# Patient Record
Sex: Female | Born: 1970 | Race: Black or African American | Hispanic: No | Smoking: Never smoker
Health system: Southern US, Community
[De-identification: ages and names within clinical notes are randomized; demographics above are authoritative.]

## PROBLEM LIST (undated history)

## (undated) DIAGNOSIS — Z98811 Dental restoration status: Secondary | ICD-10-CM

## (undated) DIAGNOSIS — T7840XA Allergy, unspecified, initial encounter: Secondary | ICD-10-CM

## (undated) DIAGNOSIS — S83249A Other tear of medial meniscus, current injury, unspecified knee, initial encounter: Secondary | ICD-10-CM

## (undated) DIAGNOSIS — I10 Essential (primary) hypertension: Secondary | ICD-10-CM

## (undated) HISTORY — DX: Allergy, unspecified, initial encounter: T78.40XA

---

## 1997-07-16 ENCOUNTER — Inpatient Hospital Stay (HOSPITAL_COMMUNITY): Admission: AD | Admit: 1997-07-16 | Discharge: 1997-07-20 | Payer: Self-pay | Admitting: *Deleted

## 1997-09-17 ENCOUNTER — Encounter (HOSPITAL_COMMUNITY): Admission: RE | Admit: 1997-09-17 | Discharge: 1997-12-16 | Payer: Self-pay | Admitting: *Deleted

## 1997-12-21 ENCOUNTER — Encounter (HOSPITAL_COMMUNITY): Admission: RE | Admit: 1997-12-21 | Discharge: 1998-03-21 | Payer: Self-pay | Admitting: *Deleted

## 1999-01-18 ENCOUNTER — Encounter: Payer: Self-pay | Admitting: *Deleted

## 1999-01-18 ENCOUNTER — Ambulatory Visit (HOSPITAL_COMMUNITY): Admission: RE | Admit: 1999-01-18 | Discharge: 1999-01-18 | Payer: Self-pay | Admitting: *Deleted

## 1999-05-02 ENCOUNTER — Inpatient Hospital Stay (HOSPITAL_COMMUNITY): Admission: AD | Admit: 1999-05-02 | Discharge: 1999-05-05 | Payer: Self-pay | Admitting: *Deleted

## 1999-12-13 ENCOUNTER — Emergency Department (HOSPITAL_COMMUNITY): Admission: EM | Admit: 1999-12-13 | Discharge: 1999-12-13 | Payer: Self-pay | Admitting: Emergency Medicine

## 2000-03-26 HISTORY — PX: ENDOMETRIAL ABLATION: SHX621

## 2000-03-26 HISTORY — PX: TUBAL LIGATION: SHX77

## 2000-08-15 ENCOUNTER — Other Ambulatory Visit: Admission: RE | Admit: 2000-08-15 | Discharge: 2000-08-15 | Payer: Self-pay | Admitting: *Deleted

## 2001-09-15 ENCOUNTER — Other Ambulatory Visit: Admission: RE | Admit: 2001-09-15 | Discharge: 2001-09-15 | Payer: Self-pay | Admitting: Obstetrics and Gynecology

## 2002-10-30 ENCOUNTER — Other Ambulatory Visit: Admission: RE | Admit: 2002-10-30 | Discharge: 2002-10-30 | Payer: Self-pay | Admitting: Obstetrics and Gynecology

## 2003-12-25 ENCOUNTER — Emergency Department (HOSPITAL_COMMUNITY): Admission: EM | Admit: 2003-12-25 | Discharge: 2003-12-26 | Payer: Self-pay | Admitting: Emergency Medicine

## 2004-03-26 HISTORY — PX: KNEE ARTHROSCOPY W/ ACL RECONSTRUCTION: SHX1858

## 2004-10-02 ENCOUNTER — Emergency Department (HOSPITAL_COMMUNITY): Admission: EM | Admit: 2004-10-02 | Discharge: 2004-10-02 | Payer: Self-pay | Admitting: Emergency Medicine

## 2004-11-01 ENCOUNTER — Encounter: Admission: RE | Admit: 2004-11-01 | Discharge: 2004-11-01 | Payer: Self-pay | Admitting: Obstetrics and Gynecology

## 2004-11-09 ENCOUNTER — Encounter: Admission: RE | Admit: 2004-11-09 | Discharge: 2004-11-09 | Payer: Self-pay | Admitting: Obstetrics and Gynecology

## 2005-05-21 ENCOUNTER — Encounter: Admission: RE | Admit: 2005-05-21 | Discharge: 2005-05-21 | Payer: Self-pay | Admitting: Obstetrics and Gynecology

## 2005-12-27 ENCOUNTER — Encounter: Admission: RE | Admit: 2005-12-27 | Discharge: 2005-12-27 | Payer: Self-pay | Admitting: Emergency Medicine

## 2006-01-11 ENCOUNTER — Encounter: Admission: RE | Admit: 2006-01-11 | Discharge: 2006-01-11 | Payer: Self-pay | Admitting: Emergency Medicine

## 2007-02-16 ENCOUNTER — Ambulatory Visit (HOSPITAL_COMMUNITY): Admission: RE | Admit: 2007-02-16 | Discharge: 2007-02-16 | Payer: Self-pay | Admitting: Family Medicine

## 2007-03-26 ENCOUNTER — Encounter: Admission: RE | Admit: 2007-03-26 | Discharge: 2007-03-26 | Payer: Self-pay | Admitting: Emergency Medicine

## 2007-03-27 HISTORY — PX: TONSILLECTOMY: SUR1361

## 2008-03-31 ENCOUNTER — Encounter: Admission: RE | Admit: 2008-03-31 | Discharge: 2008-03-31 | Payer: Self-pay | Admitting: Emergency Medicine

## 2009-04-01 ENCOUNTER — Encounter: Admission: RE | Admit: 2009-04-01 | Discharge: 2009-04-01 | Payer: Self-pay | Admitting: Internal Medicine

## 2009-04-26 ENCOUNTER — Encounter: Admission: RE | Admit: 2009-04-26 | Discharge: 2009-04-26 | Payer: Self-pay | Admitting: Internal Medicine

## 2010-01-05 ENCOUNTER — Emergency Department (HOSPITAL_COMMUNITY): Admission: EM | Admit: 2010-01-05 | Discharge: 2010-01-05 | Payer: Self-pay | Admitting: Emergency Medicine

## 2010-04-19 ENCOUNTER — Other Ambulatory Visit: Payer: Self-pay | Admitting: Obstetrics and Gynecology

## 2010-04-19 DIAGNOSIS — Z1231 Encounter for screening mammogram for malignant neoplasm of breast: Secondary | ICD-10-CM

## 2010-04-19 DIAGNOSIS — Z1239 Encounter for other screening for malignant neoplasm of breast: Secondary | ICD-10-CM

## 2010-04-27 ENCOUNTER — Ambulatory Visit: Payer: Self-pay

## 2010-05-01 ENCOUNTER — Ambulatory Visit
Admission: RE | Admit: 2010-05-01 | Discharge: 2010-05-01 | Disposition: A | Discharge status: Home / Self Care | Payer: 59 | Source: Ambulatory Visit | Attending: Obstetrics and Gynecology | Admitting: Obstetrics and Gynecology

## 2010-05-01 DIAGNOSIS — Z1231 Encounter for screening mammogram for malignant neoplasm of breast: Secondary | ICD-10-CM

## 2010-06-07 LAB — POCT PREGNANCY, URINE: Preg Test, Ur: NEGATIVE

## 2010-06-07 LAB — CBC
HCT: 40.6 % (ref 36.0–46.0)
Hemoglobin: 13.7 g/dL (ref 12.0–15.0)
MCH: 29.8 pg (ref 26.0–34.0)
MCHC: 33.7 g/dL (ref 30.0–36.0)
RBC: 4.6 MIL/uL (ref 3.87–5.11)

## 2010-06-07 LAB — COMPREHENSIVE METABOLIC PANEL
AST: 18 U/L (ref 0–37)
Albumin: 3.3 g/dL — ABNORMAL LOW (ref 3.5–5.2)
Alkaline Phosphatase: 66 U/L (ref 39–117)
BUN: 9 mg/dL (ref 6–23)
CO2: 30 mEq/L (ref 19–32)
Chloride: 106 mEq/L (ref 96–112)
Creatinine, Ser: 0.72 mg/dL (ref 0.4–1.2)
GFR calc non Af Amer: >60 mL/min (ref >60)
Potassium: 3.8 mEq/L (ref 3.5–5.1)
Total Bilirubin: 0.7 mg/dL (ref 0.3–1.2)

## 2010-06-07 LAB — CK: Total CK: 79 U/L (ref 7–177)

## 2010-06-07 LAB — URINALYSIS, ROUTINE W REFLEX MICROSCOPIC
Bilirubin Urine: NEGATIVE
Glucose, UA: NEGATIVE mg/dL
Hgb urine dipstick: NEGATIVE
Ketones, ur: NEGATIVE mg/dL
Specific Gravity, Urine: 1.01 (ref 1.005–1.030)
pH: 7.5 (ref 5.0–8.0)

## 2010-06-07 LAB — DIFFERENTIAL
Basophils Absolute: 0 10*3/uL (ref 0.0–0.1)
Basophils Relative: 0 % (ref 0–1)
Eosinophils Relative: 2 % (ref 0–5)
Lymphocytes Relative: 27 % (ref 12–46)
Monocytes Absolute: 0.5 10*3/uL (ref 0.1–1.0)
Neutro Abs: 4.5 10*3/uL (ref 1.7–7.7)

## 2010-10-12 ENCOUNTER — Emergency Department (HOSPITAL_COMMUNITY)
Admission: EM | Admit: 2010-10-12 | Discharge: 2010-10-12 | Disposition: A | Discharge status: Home / Self Care | Payer: 59 | Attending: Emergency Medicine | Admitting: Emergency Medicine

## 2010-10-12 DIAGNOSIS — I10 Essential (primary) hypertension: Secondary | ICD-10-CM | POA: Insufficient documentation

## 2010-10-12 DIAGNOSIS — R51 Headache: Secondary | ICD-10-CM | POA: Insufficient documentation

## 2011-01-16 ENCOUNTER — Other Ambulatory Visit: Payer: Self-pay | Admitting: Obstetrics and Gynecology

## 2011-02-24 HISTORY — PX: ABDOMINAL HYSTERECTOMY: SHX81

## 2011-03-02 ENCOUNTER — Encounter (HOSPITAL_COMMUNITY): Payer: Self-pay | Admitting: Pharmacist

## 2011-03-05 NOTE — Patient Instructions (Addendum)
    Your procedure is scheduled on: Monday, Dec 17th  Enter through the [employer] [facility] at: 8:30am Pick up the phone at the desk and dial [phone] and inform us of your arrival.  Please call this number if you have any problems the morning of surgery: [phone]  Remember: Do not eat food after midnight: Sunday Do not drink clear liquids after: Sunday Take these medicines the morning of surgery with a SIP OF WATER: None  Do not wear jewelry, make-up, or FINGER nail polish Do not wear lotions, powders, perfumes or deodorant. Do not shave 48 hours prior to surgery. Do not bring valuables to the hospital.  Leave suitcase in the car. After Surgery it may be brought to your room. For patients being admitted to the hospital, checkout time is 11:00am the day of discharge. Home with Daughter Warrick Parisian  cell [phone]  Remember to use your hibiclens as instructed.Please shower with 1/2 bottle the evening before your surgery and the other 1/2 bottle the morning of surgery.

## 2011-03-06 ENCOUNTER — Encounter (HOSPITAL_COMMUNITY): Payer: Self-pay

## 2011-03-06 ENCOUNTER — Ambulatory Visit (INDEPENDENT_AMBULATORY_CARE_PROVIDER_SITE_OTHER): Payer: 59

## 2011-03-06 ENCOUNTER — Encounter (HOSPITAL_COMMUNITY)
Admission: RE | Admit: 2011-03-06 | Discharge: 2011-03-06 | Disposition: A | Discharge status: Home / Self Care | Payer: 59 | Source: Ambulatory Visit | Attending: Obstetrics and Gynecology | Admitting: Obstetrics and Gynecology

## 2011-03-06 DIAGNOSIS — J40 Bronchitis, not specified as acute or chronic: Secondary | ICD-10-CM

## 2011-03-06 DIAGNOSIS — R5383 Other fatigue: Secondary | ICD-10-CM

## 2011-03-06 DIAGNOSIS — I10 Essential (primary) hypertension: Secondary | ICD-10-CM

## 2011-03-06 DIAGNOSIS — R5381 Other malaise: Secondary | ICD-10-CM

## 2011-03-06 DIAGNOSIS — Z79899 Other long term (current) drug therapy: Secondary | ICD-10-CM

## 2011-03-06 HISTORY — DX: Essential (primary) hypertension: I10

## 2011-03-06 LAB — CBC
Hemoglobin: 14.2 g/dL (ref 12.0–15.0)
MCH: 29.7 pg (ref 26.0–34.0)
MCHC: 33.9 g/dL (ref 30.0–36.0)
Platelets: 221 10*3/uL (ref 150–400)

## 2011-03-06 LAB — BASIC METABOLIC PANEL
BUN: 9 mg/dL (ref 6–23)
Calcium: 9.2 mg/dL (ref 8.4–10.5)
GFR calc Af Amer: >90 mL/min (ref >90)
GFR calc non Af Amer: >90 mL/min (ref >90)
Potassium: 3.8 mEq/L (ref 3.5–5.1)
Sodium: 135 mEq/L (ref 135–145)

## 2011-03-06 LAB — SURGICAL PCR SCREEN
MRSA, PCR: NEGATIVE
Staphylococcus aureus: NEGATIVE

## 2011-03-06 NOTE — Pre-Procedure Instructions (Signed)
Dr Malen Gauze saw this patient at pre-op appt.

## 2011-03-06 NOTE — Anesthesia Preprocedure Evaluation (Addendum)
Anesthesia Evaluation  Patient identified by MRN, date of birth, ID band Patient awake    Reviewed: Allergy & Precautions, H&P , NPO status , Patient's Chart, lab work & pertinent test results, reviewed documented beta blocker date and time   Airway Mallampati: I TM Distance: >3 FB Neck ROM: Full    Dental  (+) Teeth Intact   Pulmonary Recent URI  (has finished z-pack, no fever since prior to Dec 6th, never productive cough - 03/12/11), Resolved,  12/11 - Patient has current URI, placed on Z-Pak yesterday along with Tessalon Pearls. No fever, no chills, clear runny nose, and dry cough.  clear to auscultation        Cardiovascular Exercise Tolerance: Good hypertension (135/84 today), On Medications Regular Normal Takes HCTZ for BP.   Neuro/Psych Negative Neurological ROS  Negative Psych ROS   GI/Hepatic negative GI ROS, Neg liver ROS,   Endo/Other  Morbid obesity  Renal/GU negative Renal ROS  Genitourinary negative   Musculoskeletal negative musculoskeletal ROS (+)   Abdominal (+) obese,   Peds  Hematology negative hematology ROS (+)   Anesthesia Other Findings   Reproductive/Obstetrics negative OB ROS                         Anesthesia Physical Anesthesia Plan  ASA: III  Anesthesia Plan: General   Post-op Pain Management:    Induction: Intravenous  Airway Management Planned: Oral ETT  Additional Equipment:   Intra-op Plan:   Post-operative Plan:   Informed Consent: I have reviewed the patients History and Physical, chart, labs and discussed the procedure including the risks, benefits and alternatives for the proposed anesthesia with the patient or authorized representative who has indicated his/her understanding and acceptance.   Dental Advisory Given  Plan Discussed with: Anesthesiologist, Surgeon and CRNA  Anesthesia Plan Comments: (Discussed with patient risks of GETA with  current URI Patient to see regular MD today. Will reevaluate DOS if not cancelled by PMD.)       Anesthesia Quick Evaluation

## 2011-03-08 NOTE — H&P (Signed)
NAMEJUDYE, LORINO                 ACCOUNT NO.:  [id number]  MEDICAL RECORD NO.:  [id number]  LOCATION:                                 FACILITY:  PHYSICIAN:  Naima A. Dillard, M.D. DATE OF BIRTH:  [birth year]  DATE OF ADMISSION: DATE OF DISCHARGE:                             HISTORY & PHYSICAL   HISTORY OF PRESENT ILLNESS:  Ms. 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  The patient has a longstanding history of menometrorrhagia and underwent a Thermachoice endometrial ablation in 2003.  Prior to that procedure, the patient would flow 21 of 28 days that was so heavy that it would often soil her clothing and her linen. She denies any cramping however with that bleeding.  For 2 years following the ablation, the patient was amenorrheic, however, after that time, she began to experience severe menstrual cramping which she rates at a 10/10 on a 10-point pain scale and began to flow 3-7 days out of each month accompanied by intermenstrual spotting.  By 2008, the patient continued this course, however, her bleeding increased with episodes of "gushes" of blood occurring without notice.  For a short time, the patient was managed with oral contraceptives, however, that therapy quickly became ineffective.  Currently, the patient experiences 4 days of spotting prior to a 5 day heavy menstrual flow that again has begun to soil her clothes due to its volume.  Following that bleeding, she will again experience 4 additional days of spotting.  In October 2012, the patient had an endometrial biopsy that did not show any atypia, hyperplasia, or malignancy.  A pelvic ultrasound at that same time showed a uterus measuring 8.51 x 4.46 x 4.89 cm.  It was heterogeneous, anteverted, with 2 small intramural fibroid measuring 1.1 x 0.94 x 1.3 cm in the anterior of  the uterus, and a fundal fibroid measuring 2.1 x 1.4 x 2.2 cm.  Both of the patient's ovaries appeared normal on that study.  The patient denies any urinary tract symptoms, changes in her bowel movements, vaginitis symptoms, except as is related to her menstrual flow, she denies any pelvic pain.  A review of both medical and surgical management options were given to the patient, however, she has decided to proceed with definitive therapy in the form of hysterectomy.  PAST MEDICAL HISTORY:  OB History:  Gravida 4, para 3-0-1-3.  The patient had a spontaneous vaginal birth in 1994, 1999, and 2001.  She experienced pregnancy-induced hypertension and her largest baby weighed 8 pounds 5 ounces.  GYN History:  Menarche at 40 year old.  Last menstrual period February 15, 2011.  She uses tubal ligation as her method of contraception.  She has a history of herpes simplex virus # 2 and a remote history of Chlamydia.  She has a history of an abnormal Pap smear, however, that Pap smear once repeated, returned normal, and have been normal since. Her most recent normal Pap smear was August 2012.  Medical History:  Hypertension, anemia, right ankle fracture (1979) and vitamin D deficiency.  SURGERY:  In 2003, Thermachoice  endometrial ablation.  In 2003, bilateral tubal ligation.  In 2006, left knee anterior cruciate ligament repair.  In 2010, tonsillectomy.  Denies any history of blood transfusions or problems with anesthesia.  FAMILY HISTORY:  Cardiovascular disease.  Mother with breast cancer, hypertension, and anemia.  SOCIAL HISTORY:  The patient is divorced and she works for [employer].  HABITS:  She does not use tobacco, alcohol, or illicit drugs.  CURRENT MEDICATIONS: 1. Multivitamin 1 tab daily. 2. Hydrochlorothiazide 12.5 mg daily. 3. Robitussin CF as directed.  ALLERGIES:  The patient has a sensitivity to NOROXIN, which causes cramping, LISINOPRIL causes profound frustration with  leg cramps and swelling.  REVIEW OF SYSTEMS:  She denies any chest pain, shortness of breath, headache, vision changes, nausea, vomiting, diarrhea, myalgias, arthralgias, skin rashes.  She does admit to a dry cough, discomfort with coughing, however, is scheduled to see her family physician today for evaluation.  PHYSICAL EXAM:  VITAL SIGNS:  Blood pressure 130/82, pulse is 78, temperature 98.5 degrees Fahrenheit orally, weight 193 pounds.  Height 5 feet 2 inches tall.  Body mass index 35.3. NECK:  Supple without masses.  There is no thyromegaly or cervical adenopathy. HEART:  Regular rate and rhythm. LUNGS:  Clear. BACK:  No CVA tenderness. ABDOMEN:  No tenderness, guarding, rebound, or organomegaly. EXTREMITIES:  No clubbing, cyanosis, or edema. PELVIC:  EG, BUS is normal.  Vagina is normal.  Cervix is nontender without lesions.  Uterus appears normal size, shape, and consistency without tenderness.  Adnexa without tenderness or masses.  IMPRESSION: 1. Menometrorrhagia. 2. CIN-1. 3. Status post endometrial ablation.  DISPOSITION:  A discussion was held with the patient regarding indications for her procedure along with its risks, which include, but are not limited to, reaction to anesthesia, damage to adjacent organs, infection, excessive bleeding, pelvic prolapse, and the possibility of early menopause.  The patient was given a MiraLax bowel prep to be completed 24 hours prior to her procedure.  The patient verbalized understanding of these risk and has consented to proceed with a total laparoscopic hysterectomy, followed by cystoscopy at [facility] on March 12, 2011.     Esteen Delpriore J. Lowell Guitar, P.A.-C   ______________________________ Pierre Bali Normand Sloop, M.D.   EJP/MEDQ  D:  03/07/2011  T:  03/08/2011  Job:  [id number]

## 2011-03-10 ENCOUNTER — Ambulatory Visit (INDEPENDENT_AMBULATORY_CARE_PROVIDER_SITE_OTHER): Payer: 59

## 2011-03-10 DIAGNOSIS — I10 Essential (primary) hypertension: Secondary | ICD-10-CM

## 2011-03-10 DIAGNOSIS — J209 Acute bronchitis, unspecified: Secondary | ICD-10-CM

## 2011-03-12 ENCOUNTER — Encounter (HOSPITAL_COMMUNITY): Payer: Self-pay | Admitting: *Deleted

## 2011-03-12 ENCOUNTER — Encounter (HOSPITAL_COMMUNITY)
Admission: RE | Disposition: A | Discharge status: Home / Self Care | Payer: Self-pay | Source: Ambulatory Visit | Attending: Obstetrics and Gynecology

## 2011-03-12 ENCOUNTER — Other Ambulatory Visit: Payer: Self-pay | Admitting: Obstetrics and Gynecology

## 2011-03-12 ENCOUNTER — Encounter (HOSPITAL_COMMUNITY): Payer: Self-pay | Admitting: Anesthesiology

## 2011-03-12 ENCOUNTER — Ambulatory Visit (HOSPITAL_COMMUNITY)
Admission: RE | Admit: 2011-03-12 | Discharge: 2011-03-13 | Disposition: A | Discharge status: Home / Self Care | Payer: 59 | Source: Ambulatory Visit | Attending: Obstetrics and Gynecology | Admitting: Obstetrics and Gynecology

## 2011-03-12 ENCOUNTER — Ambulatory Visit (HOSPITAL_COMMUNITY): Payer: 59 | Admitting: Certified Registered Nurse Anesthetist

## 2011-03-12 DIAGNOSIS — N920 Excessive and frequent menstruation with regular cycle: Secondary | ICD-10-CM | POA: Insufficient documentation

## 2011-03-12 DIAGNOSIS — N870 Mild cervical dysplasia: Secondary | ICD-10-CM | POA: Insufficient documentation

## 2011-03-12 DIAGNOSIS — N8000 Endometriosis of the uterus, unspecified: Secondary | ICD-10-CM | POA: Insufficient documentation

## 2011-03-12 DIAGNOSIS — Z01818 Encounter for other preprocedural examination: Secondary | ICD-10-CM | POA: Insufficient documentation

## 2011-03-12 DIAGNOSIS — Z01812 Encounter for preprocedural laboratory examination: Secondary | ICD-10-CM | POA: Insufficient documentation

## 2011-03-12 DIAGNOSIS — D251 Intramural leiomyoma of uterus: Secondary | ICD-10-CM | POA: Insufficient documentation

## 2011-03-12 HISTORY — PX: LAPAROSCOPIC HYSTERECTOMY: SHX1926

## 2011-03-12 HISTORY — PX: CYSTOSCOPY: SHX5120

## 2011-03-12 SURGERY — HYSTERECTOMY, TOTAL, LAPAROSCOPIC
Anesthesia: General

## 2011-03-12 MED ORDER — MIDAZOLAM HCL 2 MG/2ML IJ SOLN
INTRAMUSCULAR | Status: AC
  Filled 2011-03-12: qty 2

## 2011-03-12 MED ORDER — METOCLOPRAMIDE HCL 5 MG/ML IJ SOLN: 10.0000 mg | Freq: Once | INTRAMUSCULAR | Status: DC | PRN

## 2011-03-12 MED ORDER — AMLODIPINE BESYLATE 10 MG PO TABS
10.0000 mg | ORAL_TABLET | ORAL | Status: DC
  Administered 2011-03-12: 10 mg via ORAL
  Filled 2011-03-12 (×2): qty 1

## 2011-03-12 MED ORDER — PROMETHAZINE HCL 25 MG/ML IJ SOLN: 12.5000 mg | INTRAMUSCULAR | Status: DC | PRN

## 2011-03-12 MED ORDER — GLYCOPYRROLATE 0.2 MG/ML IJ SOLN
INTRAMUSCULAR | Status: DC | PRN
  Administered 2011-03-12: .6 mg via INTRAVENOUS

## 2011-03-12 MED ORDER — LIDOCAINE HCL (CARDIAC) 20 MG/ML IV SOLN
INTRAVENOUS | Status: DC | PRN
  Administered 2011-03-12: 80 mg via INTRAVENOUS

## 2011-03-12 MED ORDER — SODIUM CHLORIDE 0.9 % IJ SOLN: 9.0000 mL | INTRAMUSCULAR | Status: DC | PRN

## 2011-03-12 MED ORDER — ZOLPIDEM TARTRATE 5 MG PO TABS: 5.0000 mg | ORAL_TABLET | Freq: Every evening | ORAL | Status: DC | PRN

## 2011-03-12 MED ORDER — DEXAMETHASONE SODIUM PHOSPHATE 4 MG/ML IJ SOLN
INTRAMUSCULAR | Status: DC | PRN
  Administered 2011-03-12 (×2): 5 mg via INTRAVENOUS

## 2011-03-12 MED ORDER — DIPHENHYDRAMINE HCL 50 MG/ML IJ SOLN: 12.5000 mg | Freq: Four times a day (QID) | INTRAMUSCULAR | Status: DC | PRN

## 2011-03-12 MED ORDER — PROPOFOL 10 MG/ML IV EMUL
INTRAVENOUS | Status: AC
  Filled 2011-03-12: qty 20

## 2011-03-12 MED ORDER — GLYCOPYRROLATE 0.2 MG/ML IJ SOLN
INTRAMUSCULAR | Status: AC
  Filled 2011-03-12: qty 2

## 2011-03-12 MED ORDER — HYDROCODONE-ACETAMINOPHEN 5-325 MG PO TABS: 1.0000 | ORAL_TABLET | ORAL | Status: DC | PRN

## 2011-03-12 MED ORDER — KETOROLAC TROMETHAMINE 30 MG/ML IJ SOLN
INTRAMUSCULAR | Status: DC | PRN
  Administered 2011-03-12: 30 mg via INTRAVENOUS

## 2011-03-12 MED ORDER — LACTATED RINGERS IR SOLN
Status: DC | PRN
  Administered 2011-03-12: 3000 mL

## 2011-03-12 MED ORDER — AMLODIPINE BESYLATE 10 MG PO TABS
10.0000 mg | ORAL_TABLET | Freq: Every day | ORAL | Status: DC
  Filled 2011-03-12 (×2): qty 1

## 2011-03-12 MED ORDER — HYDROMORPHONE HCL PF 1 MG/ML IJ SOLN
INTRAMUSCULAR | Status: DC | PRN
  Administered 2011-03-12: 1 mg via INTRAVENOUS

## 2011-03-12 MED ORDER — DIPHENHYDRAMINE HCL 12.5 MG/5ML PO ELIX
12.5000 mg | ORAL_SOLUTION | Freq: Four times a day (QID) | ORAL | Status: DC | PRN
  Filled 2011-03-12: qty 5

## 2011-03-12 MED ORDER — NEOSTIGMINE METHYLSULFATE 1 MG/ML IJ SOLN
INTRAMUSCULAR | Status: DC | PRN
  Administered 2011-03-12: 4 mg via INTRAVENOUS

## 2011-03-12 MED ORDER — ONDANSETRON HCL 4 MG/2ML IJ SOLN
INTRAMUSCULAR | Status: AC
  Filled 2011-03-12: qty 2

## 2011-03-12 MED ORDER — FENTANYL CITRATE 0.05 MG/ML IJ SOLN
INTRAMUSCULAR | Status: AC
  Filled 2011-03-12: qty 5

## 2011-03-12 MED ORDER — MIDAZOLAM HCL 5 MG/5ML IJ SOLN
INTRAMUSCULAR | Status: DC | PRN
  Administered 2011-03-12: .5 mg via INTRAVENOUS
  Administered 2011-03-12: 1.5 mg via INTRAVENOUS

## 2011-03-12 MED ORDER — LIDOCAINE HCL (CARDIAC) 20 MG/ML IV SOLN
INTRAVENOUS | Status: AC
  Filled 2011-03-12: qty 5

## 2011-03-12 MED ORDER — ROCURONIUM BROMIDE 100 MG/10ML IV SOLN
INTRAVENOUS | Status: DC | PRN
  Administered 2011-03-12 (×2): 10 mg via INTRAVENOUS
  Administered 2011-03-12: 50 mg via INTRAVENOUS

## 2011-03-12 MED ORDER — KETOROLAC TROMETHAMINE 30 MG/ML IJ SOLN
30.0000 mg | Freq: Four times a day (QID) | INTRAMUSCULAR | Status: DC
  Filled 2011-03-12 (×2): qty 1

## 2011-03-12 MED ORDER — NEOSTIGMINE METHYLSULFATE 1 MG/ML IJ SOLN
INTRAMUSCULAR | Status: AC
  Filled 2011-03-12: qty 10

## 2011-03-12 MED ORDER — MENTHOL 3 MG MT LOZG
1.0000 | LOZENGE | OROMUCOSAL | Status: DC | PRN
  Filled 2011-03-12: qty 9

## 2011-03-12 MED ORDER — NALOXONE HCL 0.4 MG/ML IJ SOLN: 0.4000 mg | INTRAMUSCULAR | Status: DC | PRN

## 2011-03-12 MED ORDER — CEFAZOLIN SODIUM 1-5 GM-% IV SOLN
1.0000 g | INTRAVENOUS | Status: AC
  Administered 2011-03-12: 1 g via INTRAVENOUS

## 2011-03-12 MED ORDER — DEXAMETHASONE SODIUM PHOSPHATE 10 MG/ML IJ SOLN
INTRAMUSCULAR | Status: AC
  Filled 2011-03-12: qty 1

## 2011-03-12 MED ORDER — HYDROMORPHONE 0.3 MG/ML IV SOLN
INTRAVENOUS | Status: DC
  Administered 2011-03-12: 17:00:00 via INTRAVENOUS
  Administered 2011-03-12: 0.4 mg via INTRAVENOUS
  Administered 2011-03-13: 0.6 mg via INTRAVENOUS
  Administered 2011-03-13: 0.4 mg via INTRAVENOUS

## 2011-03-12 MED ORDER — PROPOFOL 10 MG/ML IV EMUL
INTRAVENOUS | Status: DC | PRN
  Administered 2011-03-12: 200 mg via INTRAVENOUS

## 2011-03-12 MED ORDER — KETOROLAC TROMETHAMINE 30 MG/ML IJ SOLN
INTRAMUSCULAR | Status: AC
  Filled 2011-03-12: qty 1

## 2011-03-12 MED ORDER — HYDROMORPHONE 0.3 MG/ML IV SOLN
INTRAVENOUS | Status: AC
  Administered 2011-03-12: 0.6 mg via INTRAVENOUS
  Filled 2011-03-12: qty 25

## 2011-03-12 MED ORDER — LACTATED RINGERS IV SOLN
INTRAVENOUS | Status: DC
  Administered 2011-03-12: 09:00:00 via INTRAVENOUS

## 2011-03-12 MED ORDER — KETOROLAC TROMETHAMINE 30 MG/ML IJ SOLN
30.0000 mg | Freq: Four times a day (QID) | INTRAMUSCULAR | Status: DC
  Administered 2011-03-12 – 2011-03-13 (×2): 30 mg via INTRAVENOUS

## 2011-03-12 MED ORDER — FENTANYL CITRATE 0.05 MG/ML IJ SOLN
INTRAMUSCULAR | Status: DC | PRN
  Administered 2011-03-12: 100 ug via INTRAVENOUS
  Administered 2011-03-12: 50 ug via INTRAVENOUS
  Administered 2011-03-12 (×3): 100 ug via INTRAVENOUS

## 2011-03-12 MED ORDER — BUPIVACAINE HCL (PF) 0.25 % IJ SOLN
INTRAMUSCULAR | Status: DC | PRN
  Administered 2011-03-12: 17 mL

## 2011-03-12 MED ORDER — IBUPROFEN 600 MG PO TABS: 600.0000 mg | ORAL_TABLET | Freq: Four times a day (QID) | ORAL | Status: DC | PRN

## 2011-03-12 MED ORDER — DEXTROSE IN LACTATED RINGERS 5 % IV SOLN
INTRAVENOUS | Status: DC
  Administered 2011-03-12 – 2011-03-13 (×2): via INTRAVENOUS

## 2011-03-12 MED ORDER — ROCURONIUM BROMIDE 50 MG/5ML IV SOLN
INTRAVENOUS | Status: AC
  Filled 2011-03-12: qty 1

## 2011-03-12 MED ORDER — ONDANSETRON HCL 4 MG/2ML IJ SOLN
INTRAMUSCULAR | Status: DC | PRN
  Administered 2011-03-12 (×2): 2 mg via INTRAVENOUS

## 2011-03-12 MED ORDER — HYDROMORPHONE HCL PF 1 MG/ML IJ SOLN: 0.2500 mg | INTRAMUSCULAR | Status: DC | PRN

## 2011-03-12 MED ORDER — INDIGOTINDISULFONATE SODIUM 8 MG/ML IJ SOLN
INTRAMUSCULAR | Status: DC | PRN
  Administered 2011-03-12: 40 mg via INTRAVENOUS

## 2011-03-12 MED ORDER — INDIGOTINDISULFONATE SODIUM 8 MG/ML IJ SOLN
INTRAMUSCULAR | Status: AC
  Filled 2011-03-12: qty 5

## 2011-03-12 MED ORDER — HYDROMORPHONE HCL PF 1 MG/ML IJ SOLN
INTRAMUSCULAR | Status: AC
  Filled 2011-03-12: qty 1

## 2011-03-12 MED ORDER — ONDANSETRON HCL 4 MG/2ML IJ SOLN: 4.0000 mg | Freq: Four times a day (QID) | INTRAMUSCULAR | Status: DC | PRN

## 2011-03-12 SURGICAL SUPPLY — 42 items
BARRIER ADHS 3X4 INTERCEED (GAUZE/BANDAGES/DRESSINGS) IMPLANT
CLOTH BEACON ORANGE TIMEOUT ST (SAFETY) ×2 IMPLANT
COVER MAYO STAND STRL (DRAPES) ×2 IMPLANT
DERMABOND ADVANCED (GAUZE/BANDAGES/DRESSINGS) ×1
DERMABOND ADVANCED .7 DNX12 (GAUZE/BANDAGES/DRESSINGS) ×1 IMPLANT
DISSECTOR BLUNT TIP ENDO 5MM (MISCELLANEOUS) IMPLANT
DRAPE CAMERA CLOSED 9X96 (DRAPES) ×2 IMPLANT
EVACUATOR SMOKE 8.L (FILTER) ×4 IMPLANT
GLOVE BIO SURGEON STRL SZ 6.5 (GLOVE) ×4 IMPLANT
GLOVE BIOGEL PI IND STRL 7.0 (GLOVE) ×1 IMPLANT
GLOVE BIOGEL PI IND STRL 7.5 (GLOVE) ×1 IMPLANT
GLOVE BIOGEL PI INDICATOR 7.0 (GLOVE) ×1
GLOVE BIOGEL PI INDICATOR 7.5 (GLOVE) ×1
GOWN PREVENTION PLUS LG XLONG (DISPOSABLE) ×4 IMPLANT
HEMOSTAT SURGICEL 2X14 (HEMOSTASIS) IMPLANT
NS IRRIG 1000ML POUR BTL (IV SOLUTION) ×2 IMPLANT
OCCLUDER COLPOPNEUMO (BALLOONS) ×2 IMPLANT
PACK LAPAROSCOPY BASIN (CUSTOM PROCEDURE TRAY) ×2 IMPLANT
SCALPEL HARMONIC ACE (MISCELLANEOUS) ×2 IMPLANT
SCISSORS LAP 5X35 DISP (ENDOMECHANICALS) IMPLANT
SET CYSTO W/LG BORE CLAMP LF (SET/KITS/TRAYS/PACK) IMPLANT
SET IRRIG TUBING LAPAROSCOPIC (IRRIGATION / IRRIGATOR) ×2 IMPLANT
SLEEVE Z-THREAD 5X100MM (TROCAR) ×2 IMPLANT
SOLUTION ELECTROLUBE (MISCELLANEOUS) ×2 IMPLANT
STRIP CLOSURE SKIN 1/4X4 (GAUZE/BANDAGES/DRESSINGS) IMPLANT
SUT MNCRL AB 3-0 PS2 27 (SUTURE) ×2 IMPLANT
SUT PDS AB 1 CT1 36 (SUTURE) ×10 IMPLANT
SUT VICRYL 0 TIES 12 18 (SUTURE) IMPLANT
SUT VICRYL 0 UR6 27IN ABS (SUTURE) ×4 IMPLANT
SYR 50ML LL SCALE MARK (SYRINGE) ×2 IMPLANT
TIP UTERINE 5.1X6CM LAV DISP (MISCELLANEOUS) ×2 IMPLANT
TIP UTERINE 6.7X10CM GRN DISP (MISCELLANEOUS) IMPLANT
TIP UTERINE 6.7X6CM WHT DISP (MISCELLANEOUS) IMPLANT
TIP UTERINE 6.7X8CM BLUE DISP (MISCELLANEOUS) IMPLANT
TOWEL OR 17X24 6PK STRL BLUE (TOWEL DISPOSABLE) ×4 IMPLANT
TRAY FOLEY CATH 14FR (SET/KITS/TRAYS/PACK) ×2 IMPLANT
TROCAR BALLN 12MMX100 BLUNT (TROCAR) ×2 IMPLANT
TROCAR Z-THREAD FIOS 11X100 BL (TROCAR) ×4 IMPLANT
TROCAR Z-THREAD FIOS 5X100MM (TROCAR) ×2 IMPLANT
TUBING FILTER THERMOFLATOR (ELECTROSURGICAL) ×2 IMPLANT
WARMER LAPAROSCOPE (MISCELLANEOUS) ×2 IMPLANT
WATER STERILE IRR 1000ML POUR (IV SOLUTION) ×2 IMPLANT

## 2011-03-12 NOTE — Preoperative (Signed)
Beta Blockers   Reason not to administer Beta Blockers:Not Applicable 

## 2011-03-12 NOTE — Progress Notes (Signed)
Day of Surgery Procedure(s): HYSTERECTOMY TOTAL LAPAROSCOPIC CYSTOSCOPY  Subjective: Patient reports tolerating PO.    Objective: BP 117/76  Pulse 86  Temp(Src) 97.9 F (36.6 C) (Oral)  Resp 16  Ht 5\' 2"  (1.575 m)  Wt 87.544 kg (193 lb)  BMI 35.30 kg/m2  SpO2 98%  General: cooperative Resp: clear to auscultation bilaterally Cardio: regular rate and rhythm, S1, S2 normal, no murmur, click, rub or gallop GI: soft, non-tender; bowel sounds normal; no masses,  no organomegaly Extremities: extremities normal, atraumatic, no cyanosis or edema Vaginal Bleeding: minimal  Assessment: s/p Procedure(s): HYSTERECTOMY TOTAL LAPAROSCOPIC CYSTOSCOPY: stable  Plan: Advance diet Encourage ambulation Clear liquids  LOS: 0 days    Margrette Wynia A 03/12/2011, 6:13 PM

## 2011-03-12 NOTE — Anesthesia Procedure Notes (Signed)
Procedure Name: Intubation Date/Time: 03/12/2011 10:12 AM Performed by: Harriett Rush, Chaeli Judy ADEDAYO Pre-anesthesia Checklist: Patient identified, Emergency Drugs available, Suction available, Timeout performed and Patient being monitored Patient Re-evaluated:Patient Re-evaluated prior to inductionOxygen Delivery Method: Circle System Utilized Preoxygenation: Pre-oxygenation with 100% oxygen Intubation Type: IV induction Ventilation: Mask ventilation without difficulty Laryngoscope Size: Miller and 2 Grade View: Grade I Tube type: Oral Tube size: 7.0 mm Number of attempts: 1 Placement Confirmation: ETT inserted through vocal cords under direct vision,  positive ETCO2,  CO2 detector and breath sounds checked- equal and bilateral Secured at: 22 cm Tube secured with: Tape (Pink tape)

## 2011-03-12 NOTE — Transfer of Care (Signed)
Immediate Anesthesia Transfer of Care Note  Patient: Julie Gross  Procedure(s) Performed:  HYSTERECTOMY TOTAL LAPAROSCOPIC; CYSTOSCOPY  Patient Location: PACU  Anesthesia Type: General  Level of Consciousness: awake, alert  and oriented  Airway & Oxygen Therapy: Patient Spontanous Breathing and Patient connected to nasal cannula oxygen  Post-op Assessment: Report given to PACU RN, Post -op Vital signs reviewed and stable and Patient moving all extremities X 4  Post vital signs: Reviewed and stable  Complications: No apparent anesthesia complications

## 2011-03-12 NOTE — Op Note (Signed)
Preop Diagnosis: Menometrorrhagia, CIN I   Postop Diagnosis: Menometrorrhagia, CIN I   Procedure: HYSTERECTOMY TOTAL LAPAROSCOPIC CYSTOSCOPY   Anesthesia: General   Anesthesiologist: Amy L. Rodman Pickle, MD   Attending: Michael Litter, MD   Assistant: Osborn Coho MD  Findings:boggy uterus about 10 week size.  Normal appearing abdominal anatomy except the liver.There were areas on the liver that look like some scarring was present  Normal appearing tubes, and ovaries.    Pathology:uterus and cervix  Fluids:2300cc  UOP:300 cc EBL:500cc  Complications:none  Procedure: The patient was taken to the operating room. Given general anesthesia. Prepped and draped in the normal sterile fashion. A Foley catheter was placed. The uterus did sound to 9cm. A weighted speculum and vaginal retractors placed into the vagina. Tenaculum was placed on  The anterior lip  cervix. The cervix was dilated with Pratt dilators up to 23. A size 8 tip was used and  the rumi was placed. Attention was then turned to the abdomen. A 10 mm infraumbilical incision was made with a knife after 5 cc of 25% percent Marcaine was used for anesthesia. The subcutaneous tissue was dissected and the fascia was   cut with the knife. The fascia was circumscribed with with 3-0 Vicryl. Roseanne Reno was placed into the intra-abdominal cavity and anchored to the suture. Intraabdominal placement  was confirmed with the laparoscope.  Two 5 mm trochars were placed in the right and left lower quadrants under direct visualization of the laparoscope.  The harmonic scalpel was used to ligate and cut the uterine ovarian ligaments bilaterally.   Both round ligaments were ligated and cut with the harmonic scapel.  The bladder flap was created using the harmonic scalpel and removed away from the uterus digitally. Both uterine arteries were ligated with harmonic scalpel.  the harmonic was used to circumscribe the coring and to free the uterus and cervix. the  uterus was then pulled into the vagina. Both angles were sutured with 0 PDS.  The  remainder of the cuff was sutured with 0 PDS in interrupted stitches until the vaginal cuff was  Closed.  A totoal of 5 sutures were used.   irrigation was done.  gas was allowed to leave the abdomen to check for bleeders all pedicles were seen to be hemostatic the patient was given indigo carmine.  Cystoscopy was done.  both ureters were seen to efflux indigo carmine. The bladder had full integrity no suture or laceration was seen in  the bladder. Cystoscopy was finished a speculum was placed into the vagina.   The cuff was intact without any dehiscence. Attention was then turned back to the abdomen. The abdomen was reinsufflated with CO2 gas.   the abdomen and pelvis was irrigated and  hemostasis was noted. laparoscope.  The 10 mm port on the patients left side was closed with a 0 Vicryl using the fascial port using the fascial closure device.Trochars were removed under direct visualization of the laparoscope.    The remaining trochars were removed from the abdomen under visualization of the laparoscope. The umbilical fascia was  reapproximated by tying the circumferential suture together. The  Two 10 mm port skin incisions were closed 3-0 Monocryl .   all the remaining skin incisions were closed with Dermabond and the 10 mm skin incision were reinforced with Dermabond.  Sponge lap and needle counts were correct patient went to PACU in stable condition

## 2011-03-12 NOTE — Progress Notes (Signed)
Date of Initial H&P: 03/07/11  History reviewed, patient examined, no change in status, stable for surgery.

## 2011-03-13 ENCOUNTER — Encounter (HOSPITAL_COMMUNITY): Payer: Self-pay | Admitting: Obstetrics and Gynecology

## 2011-03-13 LAB — CBC
Platelets: 290 10*3/uL (ref 150–400)
RDW: 13.3 % (ref 11.5–15.5)
WBC: 15.8 10*3/uL — ABNORMAL HIGH (ref 4.0–10.5)

## 2011-03-13 LAB — COMPREHENSIVE METABOLIC PANEL
AST: 8 U/L (ref 0–37)
Albumin: 2.6 g/dL — ABNORMAL LOW (ref 3.5–5.2)
Alkaline Phosphatase: 59 U/L (ref 39–117)
Chloride: 107 mEq/L (ref 96–112)
Potassium: 3.7 mEq/L (ref 3.5–5.1)
Total Bilirubin: 0.4 mg/dL (ref 0.3–1.2)

## 2011-03-13 MED ORDER — PROMETHAZINE HCL 12.5 MG PO TABS: 12.5000 mg | ORAL_TABLET | Freq: Four times a day (QID) | ORAL | Status: AC | PRN

## 2011-03-13 MED ORDER — IBUPROFEN 600 MG PO TABS: 600.0000 mg | ORAL_TABLET | Freq: Four times a day (QID) | ORAL | Status: AC

## 2011-03-13 MED ORDER — MENTHOL 3 MG MT LOZG: 1.0000 | LOZENGE | OROMUCOSAL | Status: AC | PRN

## 2011-03-13 MED ORDER — HYDROCODONE-ACETAMINOPHEN 5-325 MG PO TABS: 1.0000 | ORAL_TABLET | ORAL | Status: AC | PRN

## 2011-03-13 NOTE — Progress Notes (Signed)
1 Day Post-Op Procedure(s): HYSTERECTOMY TOTAL LAPAROSCOPIC CYSTOSCOPY  Subjective: Patient reports tolerating PO and no problems voiding.    Objective: BP 117/69  Pulse 82  Temp(Src) 98.9 F (37.2 C) (Oral)  Resp 18  Ht 5\' 2"  (1.575 m)  Wt 87.544 kg (193 lb)  BMI 35.30 kg/m2  SpO2 94%  General: alert ABD nd soft nt.  Incisions clean, dry and intact  Assessment: s/p Procedure(s): HYSTERECTOMY TOTAL LAPAROSCOPIC CYSTOSCOPY: stable and tolerating diet  Plan: Discharge home  LOS: 1 day    Henley Blyth A 03/13/2011, 2:11 PM

## 2011-03-13 NOTE — Anesthesia Postprocedure Evaluation (Signed)
Anesthesia Post Note  Patient: Julie Gross  Procedure(s) Performed:  HYSTERECTOMY TOTAL LAPAROSCOPIC; CYSTOSCOPY  Anesthesia type: General  Patient location: PACU  Post pain: Pain level controlled  Post assessment: Post-op Vital signs reviewed  Post vital signs: Reviewed  Level of consciousness: sedated  Complications: No apparent anesthesia complications

## 2011-03-13 NOTE — Discharge Summary (Signed)
Physician Discharge Summary  Patient ID: Julie Gross MRN: [id number] DOB/AGE: October 21, 1970 40 y.o.  Admit date: 03/12/2011 Discharge date: 03/13/2011  Admission Diagnoses: Menometrorrhagia, CIN I  Discharge Diagnoses: Menometrorrhagia, CIN I        Active Problems:  * No active hospital problems. *    Discharged Condition: Stable   Hospital Course: On the date of admission, the patient underwent a total laparoscopic hysterectomy and cystoscopy tolerating both procedures well.  Post operative course was unremarkable with patient resuming bowel and bladder function by post operative day #1 and was therefore deemed ready for discharge home.    Disposition: Home or Self Care   Current Discharge Medication List    START taking these medications   Details  HYDROcodone-acetaminophen (NORCO) 5-325 MG per tablet Take 1-2 tablets by mouth every 4 (four) hours as needed for pain. Qty: 30 tablet, Refills: 0    ibuprofen (ADVIL,MOTRIN) 600 MG tablet Take 1 tablet (600 mg total) by mouth every 6 (six) hours. Take with food as directed for 5 days then as needed for pain. Qty: 30 tablet, Refills: 1    menthol-cetylpyridinium (CEPACOL) 3 MG lozenge Take 1 lozenge (3 mg total) by mouth every 2 (two) hours as needed. Qty: 100 tablet    promethazine (PHENERGAN) 12.5 MG tablet Take 1 tablet (12.5 mg total) by mouth every 6 (six) hours as needed for nausea. Qty: 30 tablet, Refills: 0      CONTINUE these medications which have NOT CHANGED   Details  amLODipine (NORVASC) 10 MG tablet Take 10 mg by mouth daily.         Follow-up Information    Follow up with [facility], MD. Call in 6 weeks. (Call office to get post operative appointment date/time & with any concerns.)    Contact information:   [address] [place] [address] [phone]          Signed: Patrick Jupiter 03/13/2011, 7:40 AM

## 2011-03-13 NOTE — Anesthesia Postprocedure Evaluation (Signed)
  Anesthesia Post-op Note  Patient: Julie Gross  Procedure(s) Performed:  HYSTERECTOMY TOTAL LAPAROSCOPIC; CYSTOSCOPY  Patient Location: Women's Unit  Anesthesia Type: General  Level of Consciousness: awake  Airway and Oxygen Therapy: Patient Spontanous Breathing  Post-op Pain: mild  Post-op Assessment: Post-op Vital signs reviewed  Post-op Vital Signs: Reviewed and stable  Complications: No apparent anesthesia complications

## 2011-03-13 NOTE — Addendum Note (Signed)
Addendum  created 03/13/11 [id number] by Cephus Shelling   Modules edited:Notes Section

## 2011-03-13 NOTE — Progress Notes (Signed)
Julie Gross is a40 y.o.  [id number]  Post Op Date # 1  Subjective: Patient is Doing well postoperatively. Ambulated last night without dizziness, tolerating regular diet without nausea, analgesia adequate but has not passed flatus.  Objective: Vital signs in last 24 hours: Temp:  [97.7 F (36.5 C)-99.4 F (37.4 C)] 98.7 F (37.1 C) (12/18 0559) Pulse Rate:  [65-94] 87  (12/18 0559) Resp:  [16-23] 18  (12/18 0559) BP: (102-135)/(48-84) 124/73 mmHg (12/18 0559) SpO2:  [14 %-100 %] 94 % (12/18 0559) Weight:  [87.544 kg (193 lb)] 193 lb (87.544 kg) (12/17 1632)  Intake/Output from previous day: 12/17 0701 - 12/18 0700 In: 3725.8 [P.O.:480; I.V.:3245.8] Out: 2675 [Urine:2575] Intake/Output this shift: Total I/O In: 1225.8 [P.O.:480; I.V.:745.8] Out: 2050 [Urine:2050]  Lab 03/13/11 0520 03/06/11 0938  WBC 15.8* 5.2  HGB 11.7* 14.2  HCT 34.6* 41.9  PLT 290 221     Lab 03/13/11 0520 03/06/11 0938  NA 137 135  K 3.7 3.8  CL 107 101  CO2 22 25  BUN 5* 9  CREATININE 0.59 0.69  CALCIUM 8.6 9.2  PROT 6.1 --  BILITOT 0.4 --  ALKPHOS 59 --  ALT 7 --  AST 8 --  GLUCOSE 150* 93    EXAM: Heart: RRR Lungs: occasional musical rhonchi but no wheezes or rales. Abd.  Soft, BS +, incisions with good edge approximation and no evidence of infection Ext: SCD hose in place & functional; no calf tenderness and negative Homan's sign   Assessment: s/p Procedure(s): HYSTERECTOMY TOTAL LAPAROSCOPIC CYSTOSCOPY: progressing well  Plan: Advance to PO medication Discontinue IV fluids Discharge home later today D/C foley  LOS: 1 day    Julie Barretta, PA-C     03/13/2011 6:45 AM

## 2011-04-20 ENCOUNTER — Ambulatory Visit (INDEPENDENT_AMBULATORY_CARE_PROVIDER_SITE_OTHER): Payer: 59 | Admitting: Family Medicine

## 2011-04-20 DIAGNOSIS — I10 Essential (primary) hypertension: Secondary | ICD-10-CM

## 2011-05-04 ENCOUNTER — Ambulatory Visit (INDEPENDENT_AMBULATORY_CARE_PROVIDER_SITE_OTHER): Payer: 59 | Admitting: Emergency Medicine

## 2011-05-04 VITALS — BP 134/84 | HR 74 | Temp 97.9°F | Resp 18 | Ht 62.0 in | Wt 206.0 lb

## 2011-05-04 DIAGNOSIS — M7989 Other specified soft tissue disorders: Secondary | ICD-10-CM

## 2011-05-04 DIAGNOSIS — M79609 Pain in unspecified limb: Secondary | ICD-10-CM

## 2011-05-04 DIAGNOSIS — M79606 Pain in leg, unspecified: Secondary | ICD-10-CM

## 2011-05-04 MED ORDER — TRIAMCINOLONE ACETONIDE 0.1 % EX CREA: TOPICAL_CREAM | Freq: Three times a day (TID) | CUTANEOUS | Status: AC

## 2011-05-04 NOTE — Progress Notes (Signed)
  Subjective:    Patient ID: Julie Gross, female    DOB: 04-29-1970, 41 y.o.   MRN: [id number]  HPI patient presents with a three-day history of pain and discomfort in her lower legs. She also has developed a rash which starts about mid leg and it progresses down to the ankles her feet have been spared she used the same shaving cream and menorrhagia she always uses she shaved her legs on Sunday.    Review of Systems on pertinent past recent history is a abdominal hysterectomy in December of last year     Objective:   Physical Exam there is redness and swelling over the lower legs bilaterally. They're raised red bumps extending from the ankle up to approximately mid leg. There's total sparing of the feet and ankle.        Assessment & Plan:  The appearances of a contact dermatitis involving the lower legs bilaterally. The impressive finding is sparing of the feet and ankles appear I. suspect this is some sort of contact dermatitis. This does not fit in a pattern of a cellulitis.

## 2011-05-21 ENCOUNTER — Other Ambulatory Visit: Payer: Self-pay | Admitting: Obstetrics and Gynecology

## 2011-05-21 DIAGNOSIS — Z1231 Encounter for screening mammogram for malignant neoplasm of breast: Secondary | ICD-10-CM

## 2011-06-06 ENCOUNTER — Ambulatory Visit: Payer: 59

## 2011-06-18 ENCOUNTER — Ambulatory Visit
Admission: RE | Admit: 2011-06-18 | Discharge: 2011-06-18 | Disposition: A | Discharge status: Home / Self Care | Payer: 59 | Source: Ambulatory Visit | Attending: Obstetrics and Gynecology | Admitting: Obstetrics and Gynecology

## 2011-06-18 DIAGNOSIS — Z1231 Encounter for screening mammogram for malignant neoplasm of breast: Secondary | ICD-10-CM

## 2011-06-19 ENCOUNTER — Encounter (INDEPENDENT_AMBULATORY_CARE_PROVIDER_SITE_OTHER): Payer: 59 | Admitting: Obstetrics and Gynecology

## 2011-06-19 DIAGNOSIS — R339 Retention of urine, unspecified: Secondary | ICD-10-CM

## 2011-07-20 ENCOUNTER — Ambulatory Visit (INDEPENDENT_AMBULATORY_CARE_PROVIDER_SITE_OTHER): Payer: 59 | Admitting: Family Medicine

## 2011-07-20 ENCOUNTER — Encounter: Payer: Self-pay | Admitting: Family Medicine

## 2011-07-20 VITALS — BP 126/79 | HR 61 | Temp 97.7°F | Resp 16 | Ht 62.0 in | Wt 206.0 lb

## 2011-07-20 DIAGNOSIS — I10 Essential (primary) hypertension: Secondary | ICD-10-CM

## 2011-07-20 DIAGNOSIS — Z13220 Encounter for screening for lipoid disorders: Secondary | ICD-10-CM

## 2011-07-20 DIAGNOSIS — E669 Obesity, unspecified: Secondary | ICD-10-CM

## 2011-07-20 DIAGNOSIS — M899 Disorder of bone, unspecified: Secondary | ICD-10-CM

## 2011-07-20 DIAGNOSIS — M898X9 Other specified disorders of bone, unspecified site: Secondary | ICD-10-CM

## 2011-07-20 DIAGNOSIS — Z0000 Encounter for general adult medical examination without abnormal findings: Secondary | ICD-10-CM

## 2011-07-20 LAB — POCT URINALYSIS DIPSTICK
Blood, UA: NEGATIVE
Ketones, UA: NEGATIVE
Leukocytes, UA: NEGATIVE
Spec Grav, UA: >=1.03
pH, UA: 5.5

## 2011-07-20 LAB — BASIC METABOLIC PANEL
Potassium: 4.1 mEq/L (ref 3.5–5.3)
Sodium: 141 mEq/L (ref 135–145)

## 2011-07-20 LAB — LIPID PANEL
Cholesterol: 165 mg/dL (ref 0–200)
HDL: 59 mg/dL (ref >39)
Total CHOL/HDL Ratio: 2.8 Ratio
Triglycerides: 60 mg/dL (ref <150)
VLDL: 12 mg/dL (ref 0–40)

## 2011-07-20 MED ORDER — AMLODIPINE BESYLATE 10 MG PO TABS: 10.0000 mg | ORAL_TABLET | Freq: Every day | ORAL | Status: DC

## 2011-07-20 NOTE — Patient Instructions (Addendum)
Obesity Obesity is defined as having a body mass index (BMI) of 30 or more. To calculate your BMI divide your weight in pounds by your height in inches squared and multiply that product by 703. Major illnesses resulting from long-term obesity include:  Stroke.   Heart disease.   Diabetes.   Many cancers.   Arthritis.  Obesity also complicates recovery from many other medical problems.  CAUSES   A history of obesity in your parents.   Thyroid hormone imbalance.   Environmental factors such as excess calorie intake and physical inactivity.  TREATMENT  A healthy weight loss program includes:  A calorie restricted diet based on individual calorie needs.   Increased physical activity (exercise).  An exercise program is just as important as the right low-calorie diet.  Weight-loss medicines should be used only under the supervision of your physician. These medicines help, but only if they are used with diet and exercise programs. Medicines can have side effects including nervousness, nausea, abdominal pain, diarrhea, headache, drowsiness, and depression.  An unhealthy weight loss program includes:  Fasting.   Fad diets.   Supplements and drugs.  These choices do not succeed in long-term weight control.  HOME CARE INSTRUCTIONS  To help you make the needed dietary changes:   Exercise and perform physical activity as directed by your caregiver.   Keep a daily record of everything you eat. There are many free websites to help you with this. It may be helpful to measure your foods so you can determine if you are eating the correct portion sizes.   Use low-calorie cookbooks or take special cooking classes.   Avoid alcohol. Drink more water and drinks with no calories.   Take vitamins and supplements only as recommended by your caregiver.   Weight loss support groups, Registered Dieticians, counselors, and stress reduction education can also be very helpful.  Document Released:  04/19/2004 Document Revised: 03/01/2011 Document Reviewed: 02/16/2007 [facility] Patient Information 2012 [place], [place].     Vitamin D Deficiency  Not having enough vitamin D is called a deficiency. Your body needs this vitamin to keep your bones strong and healthy. Having too little of it can make your bones soft or can cause other health problems.  HOME CARE  Take all vitamins, herbs, or nutrition drinks (supplements) as told by your doctor.   Have your blood tested 2 months after taking vitamins, herbs, or nutrition drinks.   Eat foods that have vitamin D. This includes:   Dairy products, cereals, or juices with added vitamin D. Check the label.   Fatty fish like salmon or trout.   Eggs.   Oysters.   Go outside for 10 to 15 minutes when the sun is shining. Do this 3 times a week. Do not do this if you have skin cancer.   Do not use tanning beds.   Stay at a healthy weight. Lose weight if needed.   Keep all doctor visits as told.  GET HELP IF:  You have questions.   You continue to have problems.   You feel sick to your stomach (nauseous) or throw up (vomit).   You cannot go poop (constipated).   You feel confused.   You have severe belly (abdominal) or back pain.  MAKE SURE YOU:  Understand these instructions.   Will watch your condition.   Will get help right away if you are not doing well or get worse.  Document Released: 03/01/2011 Document Reviewed: 02/27/2011 [employer] Patient Information 2012  [employer]   Hypertension As your heart beats, it forces blood through your arteries. This force is your blood pressure. If the pressure is too high, it is called hypertension (HTN) or high blood pressure. HTN is dangerous because you may have it and not know it. High blood pressure may mean that your heart has to work harder to pump blood. Your arteries may be narrow or stiff. The extra work puts you at risk for heart disease, stroke, and other problems.    Blood pressure consists of two numbers, a higher number over a lower, 110/72, for example. It is stated as "110 over 72." The ideal is below 120 for the top number (systolic) and under 80 for the bottom (diastolic). Write down your blood pressure today. You should pay close attention to your blood pressure if you have certain conditions such as:  Heart failure.   Prior heart attack.   Diabetes   Chronic kidney disease.   Prior stroke.   Multiple risk factors for heart disease.  To see if you have HTN, your blood pressure should be measured while you are seated with your arm held at the level of the heart. It should be measured at least twice. A one-time elevated blood pressure reading (especially in the Emergency Department) does not mean that you need treatment. There may be conditions in which the blood pressure is different between your right and left arms. It is important to see your caregiver soon for a recheck. Most people have essential hypertension which means that there is not a specific cause. This type of high blood pressure may be lowered by changing lifestyle factors such as:  Stress.   Smoking.   Lack of exercise.   Excessive weight.   Drug/tobacco/alcohol use.   Eating less salt.  Most people do not have symptoms from high blood pressure until it has caused damage to the body. Effective treatment can often prevent, delay or reduce that damage. TREATMENT  When a cause has been identified, treatment for high blood pressure is directed at the cause. There are a large number of medications to treat HTN. These fall into several categories, and your caregiver will help you select the medicines that are best for you. Medications may have side effects. You should review side effects with your caregiver. If your blood pressure stays high after you have made lifestyle changes or started on medicines,   Your medication(s) may need to be changed.   Other problems may need to  be addressed.   Be certain you understand your prescriptions, and know how and when to take your medicine.   Be sure to follow up with your caregiver within the time frame advised (usually within two weeks) to have your blood pressure rechecked and to review your medications.   If you are taking more than one medicine to lower your blood pressure, make sure you know how and at what times they should be taken. Taking two medicines at the same time can result in blood pressure that is too low.  SEEK IMMEDIATE MEDICAL CARE IF:  You develop a severe headache, blurred or changing vision, or confusion.   You have unusual weakness or numbness, or a faint feeling.   You have severe chest or abdominal pain, vomiting, or breathing problems.  MAKE SURE YOU:   Understand these instructions.   Will watch your condition.   Will get help right away if you are not doing well or get worse.  Document Released:  03/12/2005 Document Revised: 03/01/2011 Document Reviewed: 10/31/2007 [facility] Patient Information 2012 [place], [place].   Weight loss tea:   Combine 1/2 tesapoon each of these 3 herbs- cumin seeds, coriander seeds and fennel seeds. Brew in about 1 to 1 1/2 cups of water. Strain the tea and pour into a thermos to keep it hot. Sip on it throughout the day.

## 2011-07-24 ENCOUNTER — Encounter: Payer: 59 | Admitting: Obstetrics and Gynecology

## 2011-07-24 ENCOUNTER — Encounter: Payer: Self-pay | Admitting: Family Medicine

## 2011-07-24 DIAGNOSIS — E66812 Obesity, class 2: Secondary | ICD-10-CM | POA: Insufficient documentation

## 2011-07-24 DIAGNOSIS — I10 Essential (primary) hypertension: Secondary | ICD-10-CM | POA: Insufficient documentation

## 2011-07-24 DIAGNOSIS — E669 Obesity, unspecified: Secondary | ICD-10-CM | POA: Insufficient documentation

## 2011-07-24 NOTE — Progress Notes (Signed)
  Subjective:    Patient ID: Julie Gross, female    DOB: 10-05-1970, 41 y.o.   MRN: [id number]  HPI   This 41 y.o. AA female is here for CPE; she underwent TAH in December 2012 She had PAP  in Oct 2012 prior to surgery.She has HTN and thinks she is retaining fluid due to new BP medication .Weight gain despite exercise and diet restrictions is another issue. She has arthralgias, swelling in  ankles,and some numbness in fingers.         She works at [employer] and also works as a Hotel manager.  She is divorced and has 3 children    Last eye exam:   07/08/2011   Last dental exam: 03/ 2013    Last mmg: 05/2011    Review of Systems  Constitutional: Negative.   HENT: Negative.   Eyes: Negative.   Respiratory: Negative for cough, chest tightness and shortness of breath.   Cardiovascular: Negative for chest pain and palpitations.  Gastrointestinal: Negative for vomiting, abdominal pain and blood in stool.       Heartburn; change in stools since GYN surgery  Genitourinary: Negative for dysuria, urgency, frequency, hematuria, decreased urine volume and difficulty urinating.  Musculoskeletal: Positive for arthralgias. Negative for myalgias and gait problem.       C/o bone pain esp. In legs  Skin: Negative.   Neurological: Negative for dizziness, syncope, weakness, light-headedness and headaches.  Hematological: Negative.   Psychiatric/Behavioral: Negative.        Objective:   Physical Exam  Nursing note and vitals reviewed. Constitutional: She is oriented to person, place, and time. She appears well-developed and well-nourished. No distress.  HENT:  Head: Normocephalic and atraumatic.  Left Ear: External ear normal.  Nose: Nose normal.  Mouth/Throat: Oropharynx is clear and moist.  Eyes: EOM are normal. Pupils are equal, round, and reactive to light. No scleral icterus.  Neck: Normal range of motion. Neck supple. No thyromegaly present.    Cardiovascular: Normal rate, regular rhythm, normal heart sounds and intact distal pulses.  Exam reveals no gallop and no friction rub.   No murmur heard. Pulmonary/Chest: Effort normal and breath sounds normal. No respiratory distress.  Abdominal: Soft. Bowel sounds are normal. She exhibits no distension and no mass. There is no tenderness. There is no guarding.  Musculoskeletal: Normal range of motion. She exhibits edema. She exhibits no tenderness.       Trace pedal edema  Lymphadenopathy:    She has no cervical adenopathy.  Neurological: She is alert and oriented to person, place, and time. She has normal reflexes. No cranial nerve deficit. She exhibits normal muscle tone. Coordination normal.  Skin: Skin is warm and dry. No rash noted.  Psychiatric: She has a normal mood and affect. Her behavior is normal. Judgment and thought content normal.          Assessment & Plan:   1. Obesity, Class II, BMI 35-39.9  TSH, Vitamin D, 25-hydroxy  2. Routine general medical examination at a health care facility  Encouraged small changes in nutrition and exercise that could result in further weight loss  3. Benign essential HTN  Basic metabolic panel Continue current medication  4. Bone pain  Vitamin D, 25-hydroxy  5. Screening for hyperlipidemia  Lipid panel

## 2011-07-26 NOTE — Progress Notes (Signed)
Quick Note:  Please call pt and advise that the following labs are abnormal...  Vitamin D is below normal. Get an OTC Vitamin D3 supplement 2000 IU and take 1 capsule most days of the week. Also need to get 10-15 minutes of sun exposure most days of the week.  All other labs are normal.  Copy to pt. ______

## 2011-08-07 ENCOUNTER — Other Ambulatory Visit: Payer: Self-pay | Admitting: Internal Medicine

## 2012-03-26 DIAGNOSIS — Z0271 Encounter for disability determination: Secondary | ICD-10-CM

## 2012-05-22 ENCOUNTER — Other Ambulatory Visit: Payer: Self-pay | Admitting: Physician Assistant

## 2012-06-26 ENCOUNTER — Other Ambulatory Visit: Payer: Self-pay

## 2012-06-26 ENCOUNTER — Telehealth: Payer: Self-pay

## 2012-06-26 DIAGNOSIS — Z1231 Encounter for screening mammogram for malignant neoplasm of breast: Secondary | ICD-10-CM

## 2012-06-26 MED ORDER — AMLODIPINE BESYLATE 10 MG PO TABS: ORAL_TABLET | ORAL | Status: DC

## 2012-06-26 NOTE — Telephone Encounter (Signed)
Patient is needing to get enough BP medicine until her CPE in May 9.     Pharmacy: [facility] [address]   Best#: [phone]

## 2012-08-01 ENCOUNTER — Ambulatory Visit (INDEPENDENT_AMBULATORY_CARE_PROVIDER_SITE_OTHER): Payer: 59 | Admitting: Family Medicine

## 2012-08-01 ENCOUNTER — Encounter: Payer: Self-pay | Admitting: Family Medicine

## 2012-08-01 VITALS — BP 131/80 | HR 65 | Temp 98.7°F | Resp 16 | Ht 62.0 in | Wt 218.0 lb

## 2012-08-01 DIAGNOSIS — E559 Vitamin D deficiency, unspecified: Secondary | ICD-10-CM

## 2012-08-01 DIAGNOSIS — I10 Essential (primary) hypertension: Secondary | ICD-10-CM

## 2012-08-01 DIAGNOSIS — Z23 Encounter for immunization: Secondary | ICD-10-CM

## 2012-08-01 DIAGNOSIS — R635 Abnormal weight gain: Secondary | ICD-10-CM

## 2012-08-01 DIAGNOSIS — Z0000 Encounter for general adult medical examination without abnormal findings: Secondary | ICD-10-CM

## 2012-08-01 LAB — POCT URINALYSIS DIPSTICK
Bilirubin, UA: NEGATIVE
Nitrite, UA: NEGATIVE
pH, UA: 6.5

## 2012-08-01 LAB — IFOBT (OCCULT BLOOD): IFOBT: NEGATIVE

## 2012-08-01 MED ORDER — AMLODIPINE BESYLATE 10 MG PO TABS: ORAL_TABLET | ORAL | Status: DC

## 2012-08-01 NOTE — Patient Instructions (Addendum)
Keeping You Healthy  Get These Tests 1. Blood Pressure- Have your blood pressure checked once a year by your health care provider.  Normal blood pressure is 120/80. 2. Weight- Have your body mass index (BMI) calculated to screen for obesity.  BMI is measure of body fat based on height and weight.  You can also calculate your own BMI at https://www.west-esparza.com/. 3. Cholesterol- Have your cholesterol checked every 5 years starting at age 44 then yearly starting at age 30. 4. Chlamydia, HIV, and other sexually transmitted diseases- Get screened every year until age 60, then within three months of each new sexual provider. 5. Pap Smear- Every 1-3 years; discuss with your health care provider. 6. Mammogram- Every year starting at age 82  Take these medicines  Calcium with Vitamin D-Your body needs 1200 mg of Calcium each day and [phone] IU of Vitamin D daily.  Your body can only absorb 500 mg of Calcium at a time so Calcium must be taken in 2 or 3 divided doses throughout the day.  Multivitamin with folic acid- Once daily if it is possible for you to become pregnant.  Get these Immunizations  Gardasil-Series of three doses; prevents HPV related illness such as genital warts and cervical cancer.  Menactra-Single dose; prevents meningitis.  Tetanus shot- Every 10 years.  Flu shot-Every year.  Take these steps 1. Do not smoke-Your healthcare provider can help you quit.  For tips on how to quit go to www.smokefree.gov or call 1-800 QUITNOW. 2. Be physically active- Exercise 5 days a week for at least 30 minutes.  If you are not already physically active, start slow and gradually work up to 30 minutes of moderate physical activity.  Examples of moderate activity include walking briskly, dancing, swimming, bicycling, etc. 3. Breast Cancer- A self breast exam every month is important for early detection of breast cancer.  For more information and instruction on self breast exams, ask your  healthcare provider or [email or web address].es. 4. Eat a healthy diet- Eat a variety of healthy foods such as fruits, vegetables, whole grains, low fat milk, low fat cheeses, yogurt, lean meats, poultry and fish, beans, nuts, tofu, etc.  For more information go to www. Thenutritionsource.org 5. Drink alcohol in moderation- Limit alcohol intake to one drink or less per day. Never drink and drive. 6. Depression- Your emotional health is as important as your physical health.  If you're feeling down or losing interest in things you normally enjoy please talk to your healthcare provider about being screened for depression. 7. Dental visit- Brush and floss your teeth twice daily; visit your dentist twice a year. 8. Eye doctor- Get an eye exam at least every 2 years. 9. Helmet use- Always wear a helmet when riding a bicycle, motorcycle, rollerblading or skateboarding. 10. Safe sex- If you may be exposed to sexually transmitted infections, use a condom. 11. Seat belts- Seat belts can save your live; always wear one. 12. Smoke/Carbon Monoxide detectors- These detectors need to be installed on the appropriate level of your home. Replace batteries at least once a year. 13. Skin cancer- When out in the sun please cover up and use sunscreen 15 SPF or higher. 14. Violence- If anyone is threatening or hurting you, please tell your healthcare provider.   Obesity Obesity is having too much body fat and a body mass index (BMI) of 30 or more. BMI is a number based on your height and weight. The number is an estimate of how much body fat  you have. Obesity can happen if you eat more calories than you can burn by exercising or other activity. It can cause major health problems or emergencies.  HOME CARE  Exercise and be active as told by your doctor. Try:  Using stairs when you can.  Parking farther away from store doors.  Gardening, biking, or walking.  Eat healthy foods and drinks that  are low in calories. Eat more fruits and vegetables.  Limit fast food, sweets, and snack foods that are made with ingredients that are not natural (processed food).  Eat smaller amounts of food.  Keep a journal and write down what you eat every day. Websites can help with this.  Avoid drinking alcohol. Drink more water and drinks without calories.   Take vitamins and dietary pills (supplements) only as told by your doctor.  Try going to weight-loss support groups or classes to help lessen stress. Dieticians and counselors may also help. GET HELP RIGHT AWAY IF:  You have chest pain or tightness.  You have trouble breathing or feel short of breath.  You feel weak or have loss of feeling (numbness) in your legs.  You feel confused or have trouble talking.  You have sudden changes in your vision. MAKE SURE YOU:  Understand these instructions.  Will watch your condition.  Will get help right away if you are not doing well or get worse. Document Released: 06/04/2011 Document Reviewed: 06/04/2011 [facility] Patient Information 2013 [place], [place].       Exercise to Lose Weight Exercise and a healthy diet may help you lose weight. Your doctor may suggest specific exercises. EXERCISE IDEAS AND TIPS  Choose low-cost things you enjoy doing, such as walking, bicycling, or exercising to workout videos.  Take stairs instead of the elevator.  Walk during your lunch break.  Park your car further away from work or school.  Go to a gym or an exercise class.  Start with 5 to 10 minutes of exercise each day. Build up to 30 minutes of exercise 4 to 6 days a week.  Wear shoes with good support and comfortable clothes.  Stretch before and after working out.  Work out until you breathe harder and your heart beats faster.  Drink extra water when you exercise.  Do not do so much that you hurt yourself, feel dizzy, or get very short of breath. Exercises that burn about 150  calories:  Running 1  miles in 15 minutes.  Playing volleyball for 45 to 60 minutes.  Washing and waxing a car for 45 to 60 minutes.  Playing touch football for 45 minutes.  Walking 1  miles in 35 minutes.  Pushing a stroller 1  miles in 30 minutes.  Playing basketball for 30 minutes.  Raking leaves for 30 minutes.  Bicycling 5 miles in 30 minutes.  Walking 2 miles in 30 minutes.  Dancing for 30 minutes.  Shoveling snow for 15 minutes.  Swimming laps for 20 minutes.  Walking up stairs for 15 minutes.  Bicycling 4 miles in 15 minutes.  Gardening for 30 to 45 minutes.  Jumping rope for 15 minutes.  Washing windows or floors for 45 to 60 minutes. Document Released: 04/14/2010 Document Revised: 06/04/2011 Document Reviewed: 04/14/2010 [facility] Patient Information 2013 [place], [place].

## 2012-08-02 LAB — COMPREHENSIVE METABOLIC PANEL
AST: 11 U/L (ref 0–37)
Albumin: 3.7 g/dL (ref 3.5–5.2)
BUN: 12 mg/dL (ref 6–23)
Calcium: 9.1 mg/dL (ref 8.4–10.5)
Chloride: 106 mEq/L (ref 96–112)
Glucose, Bld: 79 mg/dL (ref 70–99)
Potassium: 3.9 mEq/L (ref 3.5–5.3)

## 2012-08-04 ENCOUNTER — Ambulatory Visit: Payer: 59

## 2012-08-05 NOTE — Progress Notes (Addendum)
Subjective:    Patient ID: Julie Gross, female    DOB: 01/05/1971, 42 y.o.   MRN: [id number]  HPI  This 42 y.o. AA female is here for CPE. She has HTN controlled on current medication and   Allergic rhinitis. The pt has MMG scheduled; she is s/p TAH for benign reason. She is divorced  and is not sexually active. She does not smoke nor consume alcohol. She does try to exercise  several days per week. Pt has had Vitamin D def in past - not taking a supplement at this time.    Patient Active Problem List   Diagnosis Date Noted  . HTN (hypertension) 07/24/2011  . Obesity, Class II, BMI 35-39.9 07/24/2011     PMHx, Surg Hx, Soc Hx and Fam Hx reviewed.    Review of Systems  Constitutional: Positive for unexpected weight change.       Weight gain despite better nutrition and regular exercise.  HENT: Negative.   Respiratory: Negative.   Cardiovascular: Negative.   Gastrointestinal: Negative.        Irreg bowel habits since TAH.  Neurological: Negative.        Irritable at times.  All other systems reviewed and are negative.       Objective:   Physical Exam  Nursing note and vitals reviewed. Constitutional: She is oriented to person, place, and time. Vital signs are normal. She appears well-developed and well-nourished. No distress.  HENT:  Head: Normocephalic and atraumatic.  Right Ear: Hearing, tympanic membrane, external ear and ear canal normal.  Left Ear: Hearing, tympanic membrane, external ear and ear canal normal.  Nose: Nose normal. No mucosal edema, rhinorrhea, nasal deformity or septal deviation.  Mouth/Throat: Uvula is midline, oropharynx is clear and moist and mucous membranes are normal. No oral lesions. Normal dentition. No dental caries.  Eyes: Conjunctivae, EOM and lids are normal. Pupils are equal, round, and reactive to light. No scleral icterus.  Fundoscopic exam:      The right eye shows no arteriolar narrowing, no AV nicking and no papilledema. The right  eye shows red reflex.       The left eye shows no arteriolar narrowing, no AV nicking and no papilledema. The left eye shows red reflex.  Neck: Normal range of motion. Neck supple. No JVD present. No thyromegaly present.  Cardiovascular: Normal rate, regular rhythm, normal heart sounds and intact distal pulses.  Exam reveals no gallop and no friction rub.   No murmur heard. Pulmonary/Chest: Effort normal and breath sounds normal. No respiratory distress. She has no wheezes. Right breast exhibits inverted nipple. Right breast exhibits no mass, no nipple discharge and no skin change. Left breast exhibits inverted nipple. Left breast exhibits no mass, no nipple discharge, no skin change and no tenderness. Breasts are symmetrical.  Abdominal: Soft. Bowel sounds are normal. She exhibits no pulsatile midline mass and no mass. There is no hepatosplenomegaly. There is no tenderness. There is no guarding and no CVA tenderness. No hernia. Hernia confirmed negative in the right inguinal area and confirmed negative in the left inguinal area.  Genitourinary: Rectum normal and vagina normal. Rectal exam shows no external hemorrhoid, no fissure, no mass, no tenderness and anal tone normal. Guaiac negative stool. No labial fusion. There is no rash, tenderness, lesion or injury on the right labia. There is no rash, tenderness, lesion or injury on the left labia. Right adnexum displays no mass, no tenderness and no fullness. Left adnexum displays no mass,  no tenderness and no fullness. No tenderness around the vagina. No vaginal discharge found.  Cervix and uterus surgically absent.  Musculoskeletal: Normal range of motion. She exhibits no edema and no tenderness.  Lymphadenopathy:    She has no cervical adenopathy.       Right: No inguinal adenopathy present.       Left: No inguinal adenopathy present.  Neurological: She is alert and oriented to person, place, and time. She has normal reflexes. No cranial nerve  deficit. She exhibits normal muscle tone. Coordination normal.  Skin: Skin is warm and dry. No rash noted. No erythema.  Psychiatric: She has a normal mood and affect. Her behavior is normal. Judgment and thought content normal.         Assessment & Plan:  Routine general medical examination at a health care facility -  Plan: IFOBT POC (occult bld, rslt in office), POCT urinalysis dipstick  Abnormal weight gain -  Plan: Vitamin D, 25-hydroxy, T3, Free, TSH, Amb ref to Medical Nutrition Therapy-MNT  Unspecified vitamin D deficiency - Plan: Vitamin D, 25-hydroxy  HTN, goal below 130/80 - Stable and controlled on current medication.  Plan: Comprehensive metabolic panel  Need for Tdap vaccination - Plan: Tdap vaccine greater than or equal to 7yo IM   Meds ordered this encounter  Medications  . amLODipine (NORVASC) 10 MG tablet    Sig: TAKE 1 TABLET BY MOUTH EVERY DAY    Dispense:  30 tablet    Refill:  11

## 2012-08-05 NOTE — Progress Notes (Signed)
Quick Note:  Please contact pt and advise that the following labs are abnormal... Comprehensive metabolic panel is normal (blood sugar, sodium, potassium, calcium, kidney and liver tests).Thyroid tests are normal.   Vitamin D level is below normal; get an OTC supplement like Oscal +D or Citracal +D and take 1 tablet Twice a day. Try to get more Vitamin D-rich foods in your diet (salmon and tuna, sardines, mushrooms, some dairy products). Some sun exposure 100-15 minutes most days of the week helps your body use the Vitamin D. Also you may want to get Vitamin D3 supplement 1000 IU and take that most days of the week.  Copy to pt. ______

## 2012-08-14 ENCOUNTER — Ambulatory Visit
Admission: RE | Admit: 2012-08-14 | Discharge: 2012-08-14 | Disposition: A | Discharge status: Home / Self Care | Payer: 59 | Source: Ambulatory Visit

## 2012-08-14 DIAGNOSIS — Z1231 Encounter for screening mammogram for malignant neoplasm of breast: Secondary | ICD-10-CM

## 2012-09-22 ENCOUNTER — Encounter: Payer: Self-pay | Admitting: Dietician

## 2012-09-22 ENCOUNTER — Encounter: Payer: 59 | Attending: Family Medicine | Admitting: Dietician

## 2012-09-22 VITALS — Ht 62.0 in | Wt 220.8 lb

## 2012-09-22 DIAGNOSIS — E669 Obesity, unspecified: Secondary | ICD-10-CM

## 2012-09-22 DIAGNOSIS — R635 Abnormal weight gain: Secondary | ICD-10-CM | POA: Insufficient documentation

## 2012-09-22 DIAGNOSIS — Z713 Dietary counseling and surveillance: Secondary | ICD-10-CM | POA: Insufficient documentation

## 2012-09-22 NOTE — Progress Notes (Signed)
Medical Nutrition Therapy:  Appt start time: 0930 end time:  1030.  Assessment:  Primary concerns today: abnormal wt gain.   Vitamin D- 22  Pt c/o abnormal wt gain since historectomy in Dec. 2012. At time, she weighed 190 lbs, and notes android wt gain pattern.  Pt works midnight to 8 or noon when mandatory overtime. She does not eat during working hours. She also picks up odd jobs, including painting, massage therapy. She has 3 children, without much support from father, so her spare time is very limited.   MEDICATIONS: see list.   DIETARY INTAKE:  Usual eating pattern includes 2-3 meals and 0-1 snacks per day.  24-hr recall:  B ( AM): oatmeal (cooked in water) with fruit and walnut with maple sugar (purchased on [employer]). OJ or coffee (1-2 cream, 3-4 packs sugar) Snk ( AM): none  L ( PM): bologna sandwich (with cheese and mustard or mayo) with water or sweet tea Snk ( PM): none D ( PM): if a rushed day, picks up [employer] chix salad sandwich on wheat with water most often, some french fries (medium) or fish filet (limits burgers), or pizza. If home cooking, will have pork chops with a veg, or hamburger helper or enchilada (uses ground [place]), or spaghetti with garlic toast, salads as a side or with chix on top for a meal. Mostly water, sometimes kool aid or sweet tea or soft drink. Snk ( PM): once per week ice cream or cookies Beverages: no EtOH, water, coffee, sweet tea, kool aid, soda, lemonade, OJ, apple juice, hot tea or herbal tea, milk (2%)  Usual physical activity: at work, walks 5-6 miles per day, good amount of manual labor at work  Progress Towards Goal(s):  In progress.   Nutritional Diagnosis:  [place]-3.3 Overweight/obesity As related to frequent consumption of high sugar beverages, high kcal take-out items.  As evidenced by pt diet recall, BMI>30.    Intervention:  Nutrition counseling provided regarding methods to control weight, kcal intake, increase kcal output. Pt  sleep pattern, hectic lifestyle largely nonmodifiable, and weight gain largely related to dietary choices, which may have stemmed from poor sleep habits and stress. RD discussed the relationship of sleep to diet choices and energy expenditure, and she stated she would not realistically be able to achieve 7 hours per night, but will make a concerted effort to improve from her current 3.5 hour per night average. RD counseled pt on choosing lower kcal items by limited foods high in sugar and fat, and highlighting high protein, high fiber foods in diet. RD also noted the importance of eating at more regular intervals (ideally 3 meals a day within 6 hours of each other). RD and pt discussed some cooking options to increase speed and decrease cost, as well as the best options when eating out, particularly at fast food restaurants. Rd recommended increased Vit D dosage to 3000 IUs per day.  Goals: Ms. Janeway will take a walk after work for 10-15 minutes each day. She will choose drinks that have no or very few calories by switching to diet soda, tea sweetened with artificial sweetener, and coffee with milk instead of cream and only one sugar or artificial sweetener. She will eat 3 meals per day, each with a high protein and high fiber food.  When eating out, remember- the mayo and fries are not your friend. Try places where there are good/free veggies!  Handouts given during visit include:  High Protein, High Fat, High Fiber, High  Sugar foods list  Monitoring/Evaluation:  Dietary intake, exercise, portion control, and body weight in 2 month(s).

## 2012-11-21 ENCOUNTER — Ambulatory Visit: Payer: 59 | Admitting: Dietician

## 2012-12-08 ENCOUNTER — Ambulatory Visit: Payer: 59 | Admitting: Dietician

## 2013-02-06 ENCOUNTER — Ambulatory Visit: Payer: 59 | Admitting: Family Medicine

## 2013-02-18 ENCOUNTER — Ambulatory Visit: Payer: 59 | Admitting: Family Medicine

## 2013-03-24 ENCOUNTER — Ambulatory Visit: Payer: 59 | Admitting: Family Medicine

## 2013-05-31 ENCOUNTER — Emergency Department (INDEPENDENT_AMBULATORY_CARE_PROVIDER_SITE_OTHER): Payer: 59

## 2013-05-31 ENCOUNTER — Encounter (HOSPITAL_COMMUNITY): Payer: Self-pay | Admitting: Emergency Medicine

## 2013-05-31 ENCOUNTER — Emergency Department (HOSPITAL_COMMUNITY)
Admission: EM | Admit: 2013-05-31 | Discharge: 2013-05-31 | Disposition: A | Discharge status: Home / Self Care | Payer: 59 | Source: Home / Self Care | Attending: Emergency Medicine | Admitting: Emergency Medicine

## 2013-05-31 DIAGNOSIS — S92919A Unspecified fracture of unspecified toe(s), initial encounter for closed fracture: Secondary | ICD-10-CM

## 2013-05-31 DIAGNOSIS — IMO0002 Reserved for concepts with insufficient information to code with codable children: Secondary | ICD-10-CM

## 2013-05-31 NOTE — Discharge Instructions (Signed)
Soak foot in warm water with epsom salt in it 4 or more times per day until the swelling goes away. Elevate and ice in between soaks. Use ibuprofen if needed for pain as directed on the bottle.  You only need to follow up with the orthopedist if your toe is not healing or something happens to make it worse.    Toe Fracture Your caregiver has diagnosed you as having a fractured toe. A toe fracture is a break in the bone of a toe. "Buddy taping" is a way of splinting your broken toe, by taping the broken toe to the toe next to it. This "buddy taping" will keep the injured toe from moving beyond normal range of motion. Buddy taping also helps the toe heal in a more normal alignment. It may take 6 to 8 weeks for the toe injury to heal. [place] your toes taped together for as long as directed by your caregiver or until you see a doctor for a follow-up examination. You can change the tape after bathing. Always use a small piece of gauze or cotton between the toes when taping them together. This will help the skin stay dry and prevent infection.  Apply ice to the injury for 15-20 minutes each hour while awake for the first 2 days. Put the ice in a plastic bag and place a towel between the bag of ice and your skin.  After the first 2 days, apply heat to the injured area. Use heat for the next 2 to 3 days. Place a heating pad on the foot or soak the foot in warm water as directed by your caregiver.  Keep your foot elevated as much as possible to lessen swelling.  Wear sturdy, supportive shoes. The shoes should not pinch the toes or fit tightly against the toes.  Your caregiver may prescribe a rigid shoe if your foot is very swollen.  Your may be given crutches if the pain is too great and it hurts too much to walk.  Only take over-the-counter or prescription medicines for pain, discomfort, or fever as directed by your caregiver.  If your caregiver has given you a follow-up  appointment, it is very important to keep that appointment. Not keeping the appointment could result in a chronic or permanent injury, pain, and disability. If there is any problem keeping the appointment, you must call back to this facility for assistance. SEEK MEDICAL CARE IF:   You have increased pain or swelling, not relieved with medications.  The pain does not get better after 1 week.  Your injured toe is cold when the others are warm. SEEK IMMEDIATE MEDICAL CARE IF:   The toe becomes cold, numb, or white.  The toe becomes hot (inflamed) and red. Document Released: 03/09/2000 Document Revised: 06/04/2011 Document Reviewed: 10/27/2007 [facility] Patient Information 2014 [place].

## 2013-05-31 NOTE — ED Notes (Signed)
Pt  Reports  She  Hit  Her  r  Foot  On a  Coffee  Table  -  She  Has  Pain  /  Swelling to r  4th  And  5 th  Toes  With  Ecchymosis    present

## 2013-05-31 NOTE — ED Provider Notes (Signed)
CSN: [id number]     Arrival date & time 05/31/13  1633 History   First MD Initiated Contact with Patient 05/31/13 1739     Chief Complaint  Patient presents with  . Foot Injury   (Consider location/radiation/quality/duration/timing/severity/associated sxs/prior Treatment) Patient is a 43 y.o. female presenting with foot injury. The history is provided by the patient.  Foot Injury Location:  Toe Time since incident:  1 day Injury: yes   Mechanism of injury comment:  Stubbed toe on coffee table Toe location:  R little toe Pain details:    Quality:  Aching and throbbing   Radiates to:  Does not radiate   Severity:  Moderate   Onset quality:  Sudden   Duration:  1 day   Timing:  Constant   Progression:  Unchanged Chronicity:  New Prior injury to area:  No Relieved by:  Nothing Worsened by:  Bearing weight and activity Ineffective treatments:  NSAIDs, rest, ice and elevation Associated symptoms: no numbness and no tingling     Past Medical History  Diagnosis Date  . Termination of pregnancy   . Hypertension   . Recurrent upper respiratory infection (URI)   . Upper respiratory infection     current uri started 02/28/11 - on meds   Past Surgical History  Procedure Laterality Date  . Endometrial ablation  2003  . 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 Tonsillectomy  2010  . Tubal ligation  2003  . Svd      x 3  . Laparoscopic hysterectomy  03/12/2011    Procedure: HYSTERECTOMY TOTAL LAPAROSCOPIC;  Surgeon: Betsy Coder, MD;  Location: [facility];  Service: Gynecology;  Laterality: N/A;  . Cystoscopy  03/12/2011    Procedure: CYSTOSCOPY;  Surgeon: Betsy Coder, MD;  Location: [facility];  Service: Gynecology;  Laterality: N/A;  . Abdominal hysterectomy     Family History  Problem Relation Age of Onset  . Hypertension Mother   . Cancer Mother 72    Breast cancer survivor  . Asthma Mother   . Hypertension Father   . Diabetes Father   . Hypertension Maternal  Grandmother   . Cancer Maternal Grandmother     Intestines  . Diabetes Maternal Grandfather   . Mental illness Paternal Grandmother     Alzheimer's dementia  . Cancer Paternal Grandfather    History  Substance Use Topics  . Smoking status: Never Smoker   . Smokeless tobacco: Never Used  . Alcohol Use: No   OB History   Grav Para Term Preterm Abortions TAB SAB Ect Mult Living                 Review of Systems  Musculoskeletal:       Toe injury  Skin: Negative for color change and wound.  Neurological: Negative for numbness.    Allergies  Lisinopril and Noroxin  Home Medications   Current Outpatient Rx  Name  Route  Sig  Dispense  Refill  . amLODipine (NORVASC) 10 MG tablet      TAKE 1 TABLET BY MOUTH EVERY DAY   30 tablet   11   . cholecalciferol (VITAMIN D) 1000 UNITS tablet   Oral   Take 1,000 Units by mouth daily.         . Multiple Vitamin (MULTIVITAMIN) tablet   Oral   Take 1 tablet by mouth daily.          BP 144/82  Pulse 60  Temp(Src) 98.5 F (36.9 C) (Oral)  Resp 16  SpO2 100%  LMP 02/13/2011 Physical Exam  Constitutional: She appears well-developed and well-nourished. No distress.  Cardiovascular:  Pulses:      Dorsalis pedis pulses are 2+ on the right side.  Musculoskeletal:       Right foot: She exhibits tenderness, bony tenderness and swelling.  R little toe tender to palp, assoc with swelling of toe and foot proximal to toe  Skin: Skin is warm, dry and intact. No erythema.    ED Course  Procedures (including critical care time) Labs Review Labs Reviewed - No data to display Imaging Review Dg Foot Complete Right  05/31/2013   CLINICAL DATA:  Fourth and fifth toe discomfort status post trauma  EXAM: RIGHT FOOT COMPLETE - 3+ VIEW  COMPARISON:  None.  FINDINGS: The patient has sustained an acute minimally angulated fracture of the base of the shaft of the proximal phalanx of the fifth toe. No definite adjacent fourth toe fracture is  demonstrated. The first through third toes appear intact. The metatarsals appear intact. There is soft tissue swelling over the fifth toe. There is a plantar calcaneal spur.  IMPRESSION: There is an acute minimally angulated fracture through the base of the shaft of the proximal phalanx of the right fifth toe.   Electronically Signed   By: David  Martinique   On: 05/31/2013 17:18     MDM   1. Closed fracture of proximal phalanx of toe   given post op shoe. Pt to use own ibuprofen. Toes too small to buddy tape. Pt to soak in warm water with epsom salt.      Carvel Getting, NP 05/31/13 1745

## 2013-05-31 NOTE — ED Provider Notes (Signed)
Medical screening examination/treatment/procedure(s) were performed by non-physician practitioner and as supervising physician I was immediately available for consultation/collaboration.  Philipp Deputy, M.D.  Harden Mo, MD 05/31/13 2118

## 2013-06-25 ENCOUNTER — Emergency Department (HOSPITAL_COMMUNITY): Payer: Worker's Compensation

## 2013-06-25 ENCOUNTER — Emergency Department (HOSPITAL_COMMUNITY)
Admission: EM | Admit: 2013-06-25 | Discharge: 2013-06-25 | Disposition: A | Discharge status: Home / Self Care | Payer: Worker's Compensation | Attending: Emergency Medicine | Admitting: Emergency Medicine

## 2013-06-25 ENCOUNTER — Encounter (HOSPITAL_COMMUNITY): Payer: Self-pay | Admitting: Emergency Medicine

## 2013-06-25 DIAGNOSIS — S59909A Unspecified injury of unspecified elbow, initial encounter: Secondary | ICD-10-CM | POA: Diagnosis present

## 2013-06-25 DIAGNOSIS — Y9389 Activity, other specified: Secondary | ICD-10-CM | POA: Insufficient documentation

## 2013-06-25 DIAGNOSIS — L089 Local infection of the skin and subcutaneous tissue, unspecified: Secondary | ICD-10-CM | POA: Diagnosis not present

## 2013-06-25 DIAGNOSIS — S50359A Superficial foreign body of unspecified elbow, initial encounter: Principal | ICD-10-CM

## 2013-06-25 DIAGNOSIS — S60859A Superficial foreign body of unspecified wrist, initial encounter: Principal | ICD-10-CM

## 2013-06-25 DIAGNOSIS — I10 Essential (primary) hypertension: Secondary | ICD-10-CM | POA: Insufficient documentation

## 2013-06-25 DIAGNOSIS — Y9289 Other specified places as the place of occurrence of the external cause: Secondary | ICD-10-CM | POA: Insufficient documentation

## 2013-06-25 DIAGNOSIS — Y990 Civilian activity done for income or pay: Secondary | ICD-10-CM | POA: Insufficient documentation

## 2013-06-25 DIAGNOSIS — Z8709 Personal history of other diseases of the respiratory system: Secondary | ICD-10-CM | POA: Diagnosis not present

## 2013-06-25 DIAGNOSIS — X58XXXA Exposure to other specified factors, initial encounter: Secondary | ICD-10-CM | POA: Insufficient documentation

## 2013-06-25 DIAGNOSIS — Z79899 Other long term (current) drug therapy: Secondary | ICD-10-CM | POA: Diagnosis not present

## 2013-06-25 DIAGNOSIS — S50859A Superficial foreign body of unspecified forearm, initial encounter: Principal | ICD-10-CM

## 2013-06-25 DIAGNOSIS — S50851A Superficial foreign body of right forearm, initial encounter: Secondary | ICD-10-CM

## 2013-06-25 MED ORDER — CEPHALEXIN 500 MG PO CAPS: 500.0000 mg | ORAL_CAPSULE | Freq: Four times a day (QID) | ORAL | Status: DC

## 2013-06-25 NOTE — ED Notes (Signed)
Pt had injury at work today, large wood shaving or splinter to right posterior forearm. Was told to come here due to possible small splinters left in her arm. Tetanus is current.

## 2013-06-25 NOTE — ED Notes (Signed)
PA at bedside to examen/suture wound.

## 2013-06-25 NOTE — Discharge Instructions (Signed)
Read the information below.  Use the prescribed medication as directed.  Please discuss all new medications with your pharmacist.  You may return to the Emergency Department at any time for worsening condition or any new symptoms that concern you.  If there is any possibility that you might be pregnant, please let your health care provider know and discuss this with the pharmacist to ensure medication safety.  If you develop redness, swelling, pus draining from the wound, or fevers greater than 100.4, return to the ER immediately for a recheck.     Wood Splinters Wood splinters need to be removed because they can cause skin irritation and infection. If they are close to the surface, splinters can usually be removed easily. Deep splinters may be hard to locate and need treatment by a surgeon. SPLINTER REMOVAL Removal of splinters by your caregiver is considered a surgical procedure.   The area is carefully cleaned. You may require a small amount of anaesthesia (medicine injected near the splinter to numb the tissue and lessen pain). After the splinter is removed, the area will be cleaned again. A bandage is applied.  If your splinter is under a fingernail or toenail, then a small section of the nail may need to be removed. As long as the splinter did not extend to the base of the nail, the nail usually grows back normally.  A splinter that is deeper, more contaminated, or that gets near a structure such as a bone, nerve or blood vessel may need to be removed by a Psychologist, sport and exercise.  You may need special X-rays or scans if the splinter is hard to locate.  Every attempt is made to remove the entire splinter. However, small particles may remain. Tell your caregiver if you feel that a part of the splinter was left behind. HOME CARE INSTRUCTIONS   Keep the injured area high up (elevated).  Use the injured area as little as possible.  Keep the injured area clean and dry. Follow any directions from your  caregiver.  Keep any follow-up or wound check appointments. You might need a tetanus shot now if:  You have no idea when you had the last one.  You have never had a tetanus shot before.  The injured area had dirt in it. Even if you have already removed the splinter, call your caregiver to get a tetanus shot if you need one.  If you need a tetanus shot, and you decide not to get one, there is a rare chance of getting tetanus. Sickness from tetanus can be serious. If you did get a tetanus shot, your arm may swell, get red and warm to the touch at the shot site. This is common and not a problem. SEEK MEDICAL CARE IF:   A splinter has been removed, but you are not better in a day or two.  You develop a temperature.  Signs of infection develop such as:  Redness, swelling or pus around the wound.  Red streaks spreading back from your wound towards your body. Document Released: 04/19/2004 Document Revised: 06/04/2011 Document Reviewed: 03/22/2008 [facility] Patient Information 2014 [place], [place].

## 2013-06-25 NOTE — ED Provider Notes (Signed)
CSN: [id number]     Arrival date & time 06/25/13  1324 History   First MD Initiated Contact with Patient 06/25/13 1516    This chart was scribed for Clayton Bibles PA-C, a non-physician practitioner working with Virgel Manifold, MD by Denice Bors, ED Scribe. This patient was seen in room TR05C/TR05C and the patient's care was started at 3:23 PM     Chief Complaint  Patient presents with  . Arm Injury     (Consider location/radiation/quality/duration/timing/severity/associated sxs/prior Treatment) The history is provided by the patient. No language interpreter was used.   HPI Comments: OLINE BELK is a 43 y.o. female who presents to the Emergency Department complaining of constant posterior right arm pain onset PTA while at work. States possible large wood splinters or shavings. She was working loading wood palates and something went wrong with a transfer, states part of the wood palate got stuck in her arm.  She pulls a piece out the size of a fingertip.  Describes pain as mild in severity. Reports associated possible skin changes around lacerations. Denies any aggravating or alleviating factors. Denies associated fever, other injuries, and numbness.States tetanus status is up to date. States she was evaluated for the same earlier to day at her job's occupational health and encouraged to come to the ED.  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 They sent to the emergency department of concern for retained foreign body Past Medical History  Diagnosis Date  . Termination of pregnancy   . Hypertension   . Recurrent upper respiratory infection (URI)   . Upper respiratory infection     current uri started 02/28/11 - on meds   Past Surgical History  Procedure Laterality Date  . Endometrial ablation  2003  . 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 Tonsillectomy   2010  . Tubal ligation  2003  . Svd      x 3  . Laparoscopic hysterectomy  03/12/2011    Procedure: HYSTERECTOMY TOTAL LAPAROSCOPIC;  Surgeon: Betsy Coder, MD;  Location: [facility];  Service: Gynecology;  Laterality: N/A;  . Cystoscopy  03/12/2011    Procedure: CYSTOSCOPY;  Surgeon: Betsy Coder, MD;  Location: [facility];  Service: Gynecology;  Laterality: N/A;  . Abdominal hysterectomy     Family History  Problem Relation Age of Onset  . Hypertension Mother   . Cancer Mother 104    Breast cancer survivor  . Asthma Mother   . Hypertension Father   . Diabetes Father   . Hypertension Maternal Grandmother   . Cancer Maternal Grandmother     Intestines  . Diabetes Maternal Grandfather   . Mental illness Paternal Grandmother     Alzheimer's dementia  . Cancer Paternal Grandfather    History  Substance Use Topics  . Smoking status: Never Smoker   . Smokeless tobacco: Never Used  . Alcohol Use: No   OB History   Grav Para Term Preterm Abortions TAB SAB Ect Mult Living                 Review of Systems  Constitutional: Negative for fever.  Skin: Positive for wound.  Neurological: Negative for weakness and numbness.  All other systems reviewed and are negative.      Allergies  Lisinopril and Noroxin  Home Medications  Current Outpatient Rx  Name  Route  Sig  Dispense  Refill  . amLODipine (NORVASC) 10 MG tablet      TAKE 1 TABLET BY MOUTH EVERY DAY   30 tablet   11   . cholecalciferol (VITAMIN D) 1000 UNITS tablet   Oral   Take 1,000 Units by mouth daily.         . meloxicam (MOBIC) 15 MG tablet   Oral   Take 15 mg by mouth daily as needed for pain.         . Multiple Vitamin (MULTIVITAMIN) tablet   Oral   Take 1 tablet by mouth daily.          BP 137/77  Pulse 67  Temp(Src) 98.1 F (36.7 C) (Oral)  Resp 18  SpO2 100%  LMP 02/13/2011 Physical Exam  Nursing note and vitals reviewed. Constitutional: She is oriented to person, place, and  time. She appears well-developed and well-nourished. No distress.  HENT:  Head: Normocephalic and atraumatic.  Eyes: EOM are normal.  Neck: Neck supple. No tracheal deviation present.  Cardiovascular: Normal rate.   Pulmonary/Chest: Effort normal. No respiratory distress.  Musculoskeletal: Normal range of motion.  Right dorsal forearm has a small laceration with erythema, edema, and ecchymosis surrounding that area.   Right radial pulse is intact.  Distal sensation intact.    Neurological: She is alert and oriented to person, place, and time.  Skin: Skin is warm and dry.  Psychiatric: She has a normal mood and affect. Her behavior is normal.    ED Course  FOREIGN BODY REMOVAL Date/Time: 06/25/2013 4:09 PM Performed by: Clayton Bibles Authorized by: Clayton Bibles Consent: Verbal consent obtained. Risks and benefits: risks, benefits and alternatives were discussed Consent given by: patient Patient understanding: patient states understanding of the procedure being performed Imaging studies: imaging studies available Body area: skin General location: upper extremity Location details: right forearm Anesthesia: local infiltration Local anesthetic: lidocaine 2% without epinephrine Anesthetic total: 2 ml Patient sedated: no Patient restrained: no Patient cooperative: yes Removal mechanism: forceps, hemostat and scalpel Tendon involvement: none Depth: subcutaneous 0 objects recovered. Post-procedure assessment: foreign body not removed Patient tolerance: Patient tolerated the procedure well with no immediate complications. Comments: No FB visualized or removed.  Site well irrigated with NS.     (including critical care time) COORDINATION OF CARE:  Nursing notes reviewed. Vital signs reviewed. Initial pt interview and examination performed.   3:26 PM-Discussed work up plan with pt at bedside, which includes  Orders Placed This Encounter  Procedures  . DG Forearm Right    Standing  Status: Standing     Number of Occurrences: 1     Standing Expiration Date:     Order Specific Question:  Reason for exam:    Answer:  ARM INJURY    Order Specific Question:  Reason for exam:    Answer:  foreign body  . Pt agrees with plan.   Treatment plan initiated:Medications - No data to display   Initial diagnostic testing ordered.     Labs Review Labs Reviewed - No data to display Imaging Review Dg Forearm Right  06/25/2013   CLINICAL DATA:  Arm injury, foreign body  EXAM: RIGHT FOREARM - 2 VIEW  COMPARISON:  None.  FINDINGS: There is no evidence of fracture or other focal bone lesions. Soft tissues are unremarkable. No radiopaque foreign body.  IMPRESSION: Negative.   Electronically Signed   By: Franchot Gallo M.D.  On: 06/25/2013 14:55  3:26 PM Nursing Notes Reviewed/ Care Coordinated Applicable Imaging Reviewed and incorporated into ED treatment Discussed results and treatment plan with pt. Pt demonstrates understanding and agrees with plan.     EKG Interpretation None      MDM   Final diagnoses:  Foreign body in right forearm    Patient sent to the emergency department by occupational health with concern for retained wooden foreign body in her forearm. X-ray is negative. I explored the wound thoroughly and did not find a foreign body. Patient was concerned a foreign body and encouraged me to extend the wound slightly to look deeper. I discussed with her the risks and benefits of doing this, the risk of retained foreign body, infection, scarring, bleeding. She stated she would like me to proceed. I incised the area very slightly and explored thoroughly and found nothing. It did irrigate the wound very thoroughly and no foreign body surface. Steri-Strips placed across the way by nurse. Patient discharged home with Keflex as the palate that entered her arm was the part that touches the floor and patient describes the workplace is very dirty. Her tetanus is up to date.  Discharged home.  Discussed result, findings, treatment, and follow up  with patient.  Pt given return precautions.  Pt verbalizes understanding and agrees with plan.      I personally performed the services described in this documentation, which was scribed in my presence. The recorded information has been reviewed and is accurate.    [place], PA-C 06/25/13 1653

## 2013-06-25 NOTE — ED Notes (Signed)
Pt is in the bathroom at this time 

## 2013-06-27 NOTE — ED Provider Notes (Signed)
Medical screening examination/treatment/procedure(s) were performed by non-physician practitioner and as supervising physician I was immediately available for consultation/collaboration.   EKG Interpretation None       Octavia Mottola J. Kysa [place]ais, MD 06/27/13 0856 

## 2013-08-05 ENCOUNTER — Other Ambulatory Visit: Payer: Self-pay | Admitting: Family Medicine

## 2013-09-02 ENCOUNTER — Encounter: Payer: Self-pay | Admitting: Family Medicine

## 2013-09-02 ENCOUNTER — Ambulatory Visit (INDEPENDENT_AMBULATORY_CARE_PROVIDER_SITE_OTHER): Payer: 59 | Admitting: Family Medicine

## 2013-09-02 ENCOUNTER — Ambulatory Visit (INDEPENDENT_AMBULATORY_CARE_PROVIDER_SITE_OTHER): Payer: 59

## 2013-09-02 VITALS — BP 140/100 | HR 89 | Temp 98.7°F | Resp 16 | Ht 61.0 in | Wt 217.2 lb

## 2013-09-02 DIAGNOSIS — R059 Cough, unspecified: Secondary | ICD-10-CM

## 2013-09-02 DIAGNOSIS — R079 Chest pain, unspecified: Secondary | ICD-10-CM

## 2013-09-02 DIAGNOSIS — I10 Essential (primary) hypertension: Secondary | ICD-10-CM

## 2013-09-02 DIAGNOSIS — R05 Cough: Secondary | ICD-10-CM

## 2013-09-02 DIAGNOSIS — J309 Allergic rhinitis, unspecified: Secondary | ICD-10-CM | POA: Insufficient documentation

## 2013-09-02 DIAGNOSIS — I517 Cardiomegaly: Secondary | ICD-10-CM

## 2013-09-02 MED ORDER — AMLODIPINE BESYLATE 10 MG PO TABS: 10.0000 mg | ORAL_TABLET | Freq: Every day | ORAL | Status: DC

## 2013-09-02 MED ORDER — DESLORATADINE 5 MG PO TABS: 5.0000 mg | ORAL_TABLET | Freq: Every day | ORAL | Status: DC

## 2013-09-02 NOTE — Progress Notes (Signed)
Subjective:    Patient ID: Julie Gross, female    DOB: May 04, 1970, 43 y.o.   MRN: [id number]  HPI  This 43 y.o. AA female has HTN; she is compliant w/ medication w/o adverse effects.  Pt reports onset of L shoulder pain radiating up the L side of neck >> posterior L shoulder >> lower back. This seemed to be precipitated by working a 16-hour shift; her work is physically demanding w/ stretching and lifting overhead. She often has muscle soreness and LBP. On 08/29/13, pt had onset of NP cough with shoulder pain; she denies palpitations, SOB, diaphoresis, n/v/d, dizziness or syncope. Pain is increased w/ leaning forward or laying on L side.  Pt has sinus pressure w/ congestion and seasonal allergies. She has not tried any OTC products. No hx of sinus infection or epistaxis.  Patient Active Problem List   Diagnosis Date Noted  . Allergic rhinitis, cause unspecified 09/02/2013  . HTN (hypertension) 07/24/2011  . Obesity, Class II, BMI 35-39.9 07/24/2011    Prior to Admission medications   Medication Sig Start Date End Date Taking? Authorizing Provider  amLODipine (NORVASC) 10 MG tablet Take 1 tablet (10 mg total) by mouth daily.   Yes Barton Fanny, MD  cholecalciferol (VITAMIN D) 1000 UNITS tablet Take 1,000 Units by mouth daily.   Yes Historical Provider, MD  meloxicam (MOBIC) 15 MG tablet Take 15 mg by mouth daily as needed for pain.   Yes Historical Provider, MD  Multiple Vitamin (MULTIVITAMIN) tablet Take 1 tablet by mouth daily.   Yes Historical Provider, MD          PMHx, Surg Hx, Soc and Fam Hx reviewed.   Review of Systems  Constitutional: Negative.   HENT: Positive for congestion and sinus pressure. Negative for ear pain, facial swelling, nosebleeds, postnasal drip, rhinorrhea, sneezing, sore throat and trouble swallowing.   Eyes: Negative.   Respiratory: Positive for cough and chest tightness. Negative for shortness of breath and wheezing.   Cardiovascular: Positive for  chest pain. Negative for palpitations and leg swelling.  Gastrointestinal: Negative.   Musculoskeletal: Positive for back pain and myalgias. Negative for gait problem.  Skin: Negative.   Neurological: Negative.   Hematological: Negative.   Psychiatric/Behavioral: Negative.        Objective:   Physical Exam  Nursing note and vitals reviewed. Constitutional: She is oriented to person, place, and time. She appears well-developed and well-nourished. No distress.  HENT:  Head: Normocephalic and atraumatic.  Right Ear: Hearing, tympanic membrane, external ear and ear canal normal.  Left Ear: Hearing, tympanic membrane, external ear and ear canal normal.  Nose: Nose normal. No rhinorrhea, nasal deformity or septal deviation. No epistaxis. Right sinus exhibits no maxillary sinus tenderness and no frontal sinus tenderness. Left sinus exhibits no maxillary sinus tenderness and no frontal sinus tenderness.  Mouth/Throat: Uvula is midline, oropharynx is clear and moist and mucous membranes are normal. No oral lesions. Normal dentition. No oropharyngeal exudate.  Neck: Normal range of motion and full passive range of motion without pain. Neck supple. No JVD present. No spinous process tenderness and no muscular tenderness present. No mass and no thyromegaly present.  Cardiovascular: Normal rate, regular rhythm, S1 normal, S2 normal, normal heart sounds and normal pulses.   No extrasystoles are present. Exam reveals no gallop and no friction rub.   No murmur heard. Pulmonary/Chest: Effort normal and breath sounds normal. No respiratory distress.  Abdominal: Normal appearance. She exhibits no distension and  no mass. There is no hepatosplenomegaly. There is no tenderness. There is no guarding and no CVA tenderness.  Musculoskeletal:       Right shoulder: Normal.       Left shoulder: She exhibits tenderness. She exhibits normal range of motion, no bony tenderness, no crepitus, no deformity and no spasm.        Cervical back: Normal.       Thoracic back: Normal.       Lumbar back: She exhibits spasm. She exhibits no deformity.  Neurological: She is alert and oriented to person, place, and time. She has normal strength. She displays no atrophy. No cranial nerve deficit or sensory deficit. She exhibits normal muscle tone. Coordination and gait normal.  Skin: Skin is warm, dry and intact. She is not diaphoretic.  Psychiatric: She has a normal mood and affect. Her speech is normal and behavior is normal. Judgment and thought content normal.    [facility] reading (PRIMARY) by  Dr. Leward Quan: CXR- Borderline cardiomegaly; increased perihilar markings w/o acute disease. No masses or infiltrates. Thoracic spine w/ early degenerative changes.     Assessment & Plan:  Cough - Symptoms c/w allergies; pt works in a [employer] but has never smoked. She had second-hand exposure during childhood. Trial Clarinex 5 mg 1 tablet daily. Await radiology over-read of xray. Plan: DG Chest 2 View  Chest pain - Evidence of thoracic spine DDD.  Plan: DG Chest 2 View  HTN (hypertension)- Stable on current medication; continue Amlodipine.  Cardiomegaly- Enlarged cardiac silhouette on CXR; may order ECHO for further evaluation. Pt so advised.  Allergic rhinitis, cause unspecified- RX: Clarinex 5 mg 1 tablet daily.  Medications  . amLODipine (NORVASC) 10 MG tablet    Sig: Take 1 tablet (10 mg total) by mouth daily.    Dispense:  30 tablet    Refill:  5  . desloratadine (CLARINEX) 5 MG tablet    Sig: Take 1 tablet (5 mg total) by mouth daily.    Dispense:  30 tablet    Refill:  3

## 2013-09-02 NOTE — Patient Instructions (Signed)
Women and Heart Disease Heart disease (HD) risk factors for both men and women are similar. Most studies addressing the diagnosis and treatment of heart disease have focused primarily on men. More research is now being done on women and heart disease.  GENDER DIFFERENCES  Symptoms of a heart attack for women may be more subtle, less typical and harder to identify.  Women are often slow to recognize heart disease risk factors and symptoms.  More women than men die from a heart attack before reaching a hospital.  Results of medical tests can vary by gender, especially electrocardiogram stress testing.  A common perception is that men are more likely to have heart problems. This is not true, especially in women after menopause.  Effects of estrogen, birth control and hormone therapy can have unique effects on the heart.  Women are more likely to be referred for a mental health evaluation of their symptoms. CAUSES Heart disease may be caused from conditions such as:  Buildup of fat-like deposits (plaques) in the blood vessels (coronary arteries) of the heart. The build up of fat-like deposits cause blockages that decrease the blood flow to the heart muscle.  Blockage or narrowing of the coronary arteries decreases oxygen to the heart muscle.  Abnormal heart rhythms or problems with the electrical system of the heart.  Heart muscle that has become enlarged or weak and cannot pump well.  Abnormal heart valves that either leak or are thickened and do not open and close properly.  Damage from infection or drugs.  Heart problems present at birth. RISK FACTORS  Family history.  Elevated blood lipid levels (cholesterol).  High blood pressure.  Diabetes.  Smoking.  Inactivity, lack of exercise.  Weighing 30% more than your ideal weight.  Age.  Past history of heart problems. SYMPTOMS  Chest discomfort or pressure, which may include the following:  Discomfort, fullness,  tightness, squeezing in center of chest that stays for a few minutes or comes and goes.  Discomfort or pressure that spreads to upper back, shoulders, neck, jaw or stomach.  Discomfort or tingling in the arms.  Profuse or clammy sweating.  Difficulty breathing.  Nausea.  Feeling your heart "flutter" or "jump."  Unexplained feelings of anxiety, fatigue or weakness.  Dizziness. DIAGNOSIS Diagnosis may include a test that:  Records the electrical activity of the heart and looks for changes (EKG [electrocardiogram]) .  Detects the presence of special proteins and enzymes that may show damage to the heart muscle (blood tests).  Looks at the blood flow through the heart and coronary arteries by using special dyes and X-rays (coronary angiography).  Uses sound waves to examine your heart valves, muscle function and blood flow within the heart (echo or echocardiogram).  Looks for symptoms as your heart works harder under stress (stress tests).  Creates images of the heart by detecting radiation following administration of a radioactive tracer (nuclear imaging).  Records the electrical activity of the heart and helps in detecting abnormalities of heart rhythm (electrophysiology).  Creates an image of the anatomy of the heart (CT heart scan). TREATMENT   Medications may be used to control your blood pressure, keep your heart beating regularly, reduce pain, and help your breathing.  If you are admitted to the hospital, the length of your stay depends on the amount of heart damage and any complications you may have.  Severe heart problems may require open heart surgery. This is a procedure where blocked coronary arteries are bypassed with a vein   from your leg.  If you have a single small coronary artery blockage and no heart damage, you may have a balloon angioplasty. This procedure uses stents that may help open the blockage and restore normal heart circulation. Stents are small,  wire, mesh-like tubes that help keep the artery open.  If needed, blood thinners may be used to dissolve clots. HOME CARE INSTRUCTIONS  Follow the treatment plan your caregiver prescribes.  Keep a list of every medicine you are taking. Keep it up to date and with you all the time.  Get help from your caregiver or pharmacist to learn the following about each medicine:  Why you are taking it.  What time of day to take it.  Possible side effects.  What foods to take with your medication and which foods to avoid.  When to stop taking your medication.  Try to maintain normal cholesterol levels.  Eat a heart healthy diet with salt and fat restrictions as advised. PREVENTION  You can reduce your risk of heart disease by doing the following:  Visit your caregiver regularly and determine whether you are at risk.  Quit smoking and keep away from those who smoke.  Get your blood pressure checked regularly and make sure it remains within normal limits.  Limit salt in your diet.  Maintain normal blood sugar and cholesterol levels.  Exercise regularly (walking or another form of aerobic activity, preferably 30 minutes continuously).  Maintain ideal body weight.  Reduce stress, anger, and depression.  If you have already had a heart attack, consult your caregiver about methods and medicines that may help in reducing your risk of having a second heart attack.  Be aware of the symptoms of heart disease and seek medical care if you develop these symptoms. SEEK IMMEDIATE MEDICAL CARE IF:  You have severe chest pain or the above symptoms, call your local emergency services (911 if in [place] THIS IS AN EMERGENCY. Do not wait to see if the pain will go away. DO NOT drive yourself to the hospital.  You notice increasing shortness of breath during rest, sleeping or with activity. Insist that providers take your complaints seriously and do a thorough heart evaluation. Document Released:  08/29/2007 Document Revised: 06/04/2011 Document Reviewed: 08/29/2007 [employer] Patient Information 2014 [employer].  

## 2013-09-04 ENCOUNTER — Telehealth: Payer: Self-pay

## 2013-09-04 MED ORDER — ALBUTEROL SULFATE HFA 108 (90 BASE) MCG/ACT IN AERS: 2.0000 | INHALATION_SPRAY | Freq: Four times a day (QID) | RESPIRATORY_TRACT | Status: DC | PRN

## 2013-09-04 NOTE — Telephone Encounter (Signed)
Dr.Mcpherson, Pt states that her coughis not any better since her last visit with you, she would like to know what she can take for her cough that will not elevate her B/P.  Best# [phone]

## 2013-09-04 NOTE — Telephone Encounter (Signed)
Spoke with pt about her NP cough. Clarinex is helpful but she is having coughing spells; states it feels like someone has sprayed dust in the back of her throat. Prescribed Albuterol MDI and pt will be sure to get instructions on it's use from the pharmacy staff. She has observed others using MDI and is familiar w/ device. Will contact clinic if this in not helpful.

## 2013-09-06 ENCOUNTER — Telehealth: Payer: Self-pay

## 2013-09-06 ENCOUNTER — Ambulatory Visit (INDEPENDENT_AMBULATORY_CARE_PROVIDER_SITE_OTHER): Payer: 59 | Admitting: Internal Medicine

## 2013-09-06 VITALS — BP 152/98 | HR 88 | Temp 98.0°F | Resp 14 | Ht 61.5 in | Wt 221.2 lb

## 2013-09-06 DIAGNOSIS — T50905A Adverse effect of unspecified drugs, medicaments and biological substances, initial encounter: Secondary | ICD-10-CM

## 2013-09-06 DIAGNOSIS — T887XXA Unspecified adverse effect of drug or medicament, initial encounter: Secondary | ICD-10-CM

## 2013-09-06 MED ORDER — CETIRIZINE HCL 10 MG PO TABS: 10.0000 mg | ORAL_TABLET | Freq: Every day | ORAL | Status: DC

## 2013-09-06 MED ORDER — FLUOCINONIDE 0.05 % EX CREA: 1.0000 "application " | TOPICAL_CREAM | Freq: Two times a day (BID) | CUTANEOUS | Status: DC

## 2013-09-06 NOTE — Telephone Encounter (Signed)
[phone] PATIENT IS CALLING TO SAY SHE WAS SEEN BY DR MCPHERSON LAST WEEK AND SHE GAVE HER RX FOR ALLERGIES AND NOW SHE HAS A RASH ON HER CHEST WOULD LIKE A CALL BACK ON WHAT SHE SHOULD DO

## 2013-09-06 NOTE — Progress Notes (Signed)
   Subjective:    Patient ID: Julie Gross, female    DOB: 10-31-1970, 43 y.o.   MRN: [id number]  HPI  Chief Complaint  Patient presents with  . Rash    Calrinex prescribed for allergies, possible allergic reaction since this am   following her second dose of Clarinex she developed a rash on her chest that is itchy No other exposures noted No pre-issues  Review of Systems Noncontributory    Objective:   Physical Exam BP 152/98  Pulse 88  Temp(Src) 98 F (36.7 C) (Oral)  Resp 14  Ht 5' 1.5" (1.562 m)  Wt 221 lb 3.2 oz (100.336 kg)  BMI 41.12 kg/m2  SpO2 99%  LMP 02/13/2011 There is a phone papular slightly erythematous rash in  distribution over the anterior chest No nodes Chest clear      Assessment & Plan:  Potential allergic reaction to ingredients of Clarinex  Change to Zyrtec Lidex ointment

## 2013-09-06 NOTE — Telephone Encounter (Signed)
Lmom to call back. 

## 2013-09-06 NOTE — Telephone Encounter (Signed)
Patient returned call and notified RTC for evaluation. She will come in today

## 2013-09-08 ENCOUNTER — Other Ambulatory Visit: Payer: Self-pay

## 2013-09-08 DIAGNOSIS — Z1231 Encounter for screening mammogram for malignant neoplasm of breast: Secondary | ICD-10-CM

## 2013-09-23 DIAGNOSIS — S83249A Other tear of medial meniscus, current injury, unspecified knee, initial encounter: Secondary | ICD-10-CM

## 2013-09-23 HISTORY — DX: Other tear of medial meniscus, current injury, unspecified knee, initial encounter: S83.249A

## 2013-09-29 ENCOUNTER — Ambulatory Visit
Admission: RE | Admit: 2013-09-29 | Discharge: 2013-09-29 | Disposition: A | Discharge status: Home / Self Care | Payer: 59 | Source: Ambulatory Visit

## 2013-09-29 DIAGNOSIS — Z1231 Encounter for screening mammogram for malignant neoplasm of breast: Secondary | ICD-10-CM

## 2013-10-15 ENCOUNTER — Encounter (HOSPITAL_BASED_OUTPATIENT_CLINIC_OR_DEPARTMENT_OTHER): Payer: Self-pay | Admitting: *Deleted

## 2013-10-15 NOTE — Pre-Procedure Instructions (Signed)
To come for BMET and EKG 

## 2013-10-15 NOTE — Progress Notes (Signed)
10/15/13 1137  [place]  Have you ever been diagnosed with sleep apnea through a sleep study? No  Do you snore loudly (loud enough to be heard through closed doors)?  1  Do you often feel tired, fatigued, or sleepy during the daytime? 1  Has anyone observed you stop breathing during your sleep? 0  Do you have, or are you being treated for high blood pressure? 1  BMI more than 35 kg/m2? 1  Age over 45 years old? 0  Gender: 0  Obstructive Sleep Apnea Score 4  Score 4 or greater  Results sent to PCP (Dr. Ellsworth Lennox)

## 2013-10-16 ENCOUNTER — Encounter (HOSPITAL_BASED_OUTPATIENT_CLINIC_OR_DEPARTMENT_OTHER)
Admission: RE | Admit: 2013-10-16 | Discharge: 2013-10-16 | Disposition: A | Discharge status: Home / Self Care | Payer: 59 | Source: Ambulatory Visit | Attending: Orthopedic Surgery | Admitting: Orthopedic Surgery

## 2013-10-16 DIAGNOSIS — X500XXA Overexertion from strenuous movement or load, initial encounter: Secondary | ICD-10-CM | POA: Diagnosis not present

## 2013-10-16 DIAGNOSIS — S83289A Other tear of lateral meniscus, current injury, unspecified knee, initial encounter: Secondary | ICD-10-CM | POA: Diagnosis not present

## 2013-10-16 DIAGNOSIS — M2240 Chondromalacia patellae, unspecified knee: Secondary | ICD-10-CM | POA: Diagnosis not present

## 2013-10-16 DIAGNOSIS — K219 Gastro-esophageal reflux disease without esophagitis: Secondary | ICD-10-CM | POA: Diagnosis not present

## 2013-10-16 DIAGNOSIS — I10 Essential (primary) hypertension: Secondary | ICD-10-CM | POA: Diagnosis not present

## 2013-10-16 DIAGNOSIS — Z6835 Body mass index (BMI) 35.0-35.9, adult: Secondary | ICD-10-CM | POA: Diagnosis not present

## 2013-10-16 DIAGNOSIS — IMO0002 Reserved for concepts with insufficient information to code with codable children: Secondary | ICD-10-CM | POA: Diagnosis present

## 2013-10-16 DIAGNOSIS — Z01810 Encounter for preprocedural cardiovascular examination: Secondary | ICD-10-CM | POA: Insufficient documentation

## 2013-10-16 DIAGNOSIS — Z01812 Encounter for preprocedural laboratory examination: Secondary | ICD-10-CM | POA: Diagnosis not present

## 2013-10-16 LAB — BASIC METABOLIC PANEL
Anion gap: 12 (ref 5–15)
BUN: 9 mg/dL (ref 6–23)
CHLORIDE: 104 meq/L (ref 96–112)
CO2: 25 mEq/L (ref 19–32)
Calcium: 8.4 mg/dL (ref 8.4–10.5)
Creatinine, Ser: 0.68 mg/dL (ref 0.50–1.10)
GFR calc non Af Amer: >90 mL/min (ref >90)
Glucose, Bld: 82 mg/dL (ref 70–99)
POTASSIUM: 4.4 meq/L (ref 3.7–5.3)
Sodium: 141 mEq/L (ref 137–147)

## 2013-10-16 NOTE — Progress Notes (Signed)
Reviewed by dr Linna Caprice ekg repeated and cleared by him

## 2013-10-19 DIAGNOSIS — I10 Essential (primary) hypertension: Secondary | ICD-10-CM | POA: Diagnosis present

## 2013-10-19 DIAGNOSIS — Z98811 Dental restoration status: Secondary | ICD-10-CM

## 2013-10-19 NOTE — H&P (Signed)
Julie Gross is an 43 y.o. female.   Chief Complaint: right knee pain HPI: Julie Gross is a 43 year old seen for follow-up from her right knee pain with a twisting injury that occurred at work on 09/15/13 when she was pushing a ladder and felt significant pain in her knee. This injury has been denied by her Worker's comp due to her significant preexisting injury to her right knee that was undergoing treatment at the same time. She had a right knee MRI on 10/07/13 that revealed a medial meniscus tear. She continues to have significant pain. She has been out of work since 09/15/13. She has been using a J&J brace.  Past Medical History  Diagnosis Date  . Hypertension     under control with med., has been on med. since 1999  . Medial meniscus tear 09/2013    right  . Dental crowns present     Past Surgical History  Procedure Laterality Date  . Endometrial ablation  2002  . Tonsillectomy  2009  . Tubal ligation  2002  . Laparoscopic hysterectomy  03/12/2011    Procedure: HYSTERECTOMY TOTAL LAPAROSCOPIC;  Surgeon: Betsy Coder, MD;  Location: [facility];  Service: Gynecology;  Laterality: N/A;  . Cystoscopy  03/12/2011    Procedure: CYSTOSCOPY;  Surgeon: Betsy Coder, MD;  Location: [facility];  Service: Gynecology;  Laterality: N/A;  . Knee arthroscopy w/ acl reconstruction Left 2006    Family History  Problem Relation Age of Onset  . Hypertension Mother   . Cancer Mother 70    Breast cancer survivor  . Asthma Mother   . Hypertension Father   . Diabetes Father   . Hypertension Maternal Grandmother   . Cancer Maternal Grandmother     Intestines  . Diabetes Maternal Grandfather   . Mental illness Paternal Grandmother     Alzheimer's dementia  . Cancer Paternal Grandfather    Social History:  reports that she has never smoked. She has never used smokeless tobacco. She reports that she does not drink alcohol or use illicit drugs.  Allergies:  Allergies  Allergen Reactions  . Lisinopril  Shortness Of Breath    SLEEPINESS  . Noroxin [Norfloxacin] Other (See Comments)    PAIN IN JOINTS, SWELLING OF TONGUE, NEAR-SYNCOPE  . Clarinex [Desloratadine] Rash    No prescriptions prior to admission   Meds ordered this encounter  Medications  . diclofenac (VOLTAREN) 75 MG EC tablet    Sig: Take 75 mg by mouth 2 (two) times daily.  . diazepam (VALIUM) 2 MG tablet    Sig: Take 2 mg by mouth every 6 (six) hours as needed for anxiety.     Review of Systems  Constitutional: Negative.   HENT: Negative.   Eyes: Negative.   Respiratory: Negative.   Cardiovascular: Negative.   Gastrointestinal: Negative.   Genitourinary: Negative.   Musculoskeletal: Positive for joint pain.       Right knee pain  Skin: Negative.   Neurological: Negative.   Endo/Heme/Allergies: Negative.   Psychiatric/Behavioral: Negative.     Height 5\' 1"  (1.549 m), weight 99.791 kg (220 lb), last menstrual period 02/13/2011. Physical Exam  Constitutional: She is oriented to person, place, and time. She appears well-developed and well-nourished.  HENT:  Head: Normocephalic and atraumatic.  Mouth/Throat: Oropharynx is clear and moist.  Eyes: Conjunctivae and EOM are normal. Pupils are equal, round, and reactive to light.  Neck: Neck supple.  Cardiovascular: Normal rate.   Respiratory: Effort  normal.  GI: Soft.  Genitourinary:  Not pertinent to current symptomatology therefore not examined.  Musculoskeletal:  Examination of her right knee reveals pain on the medial joint line positive medial McMurray's 1+ effusion, full range of motion knee is stable.  Left knee has full range of motion without pain swelling or deformity  Neurological: She is alert and oriented to person, place, and time.  Skin: Skin is warm and dry.  Psychiatric: She has a normal mood and affect. Her behavior is normal.     Assessment Patient Active Problem List   Diagnosis Date Noted  . Hypertension   . Dental crowns present   .  Medial meniscus tear 09/23/2013  . Allergic rhinitis, cause unspecified 09/02/2013  . HTN (hypertension) 07/24/2011  . Obesity, Class II, BMI 35-39.9 07/24/2011   Plan Due to her significant pain and lack of response to conservative care I recommend we proceed with right knee arthroscopy. Discussed risks benefits and possible complications of the surgery in detail and she understands this completely. We'll plan on setting her up for this at some point in the near future. She remains out of work and will be out of work for approximately 6 weeks post-op. She will need post-op physical therapy as well.  Sundeep Cary J 10/19/2013, 3:51 PM

## 2013-10-20 ENCOUNTER — Ambulatory Visit (HOSPITAL_BASED_OUTPATIENT_CLINIC_OR_DEPARTMENT_OTHER)
Admission: RE | Admit: 2013-10-20 | Discharge: 2013-10-20 | Disposition: A | Discharge status: Home / Self Care | Payer: 59 | Source: Ambulatory Visit | Attending: Orthopedic Surgery | Admitting: Orthopedic Surgery

## 2013-10-20 ENCOUNTER — Encounter (HOSPITAL_BASED_OUTPATIENT_CLINIC_OR_DEPARTMENT_OTHER)
Admission: RE | Disposition: A | Discharge status: Home / Self Care | Payer: Self-pay | Source: Ambulatory Visit | Attending: Orthopedic Surgery

## 2013-10-20 ENCOUNTER — Ambulatory Visit (HOSPITAL_BASED_OUTPATIENT_CLINIC_OR_DEPARTMENT_OTHER): Payer: 59 | Admitting: Anesthesiology

## 2013-10-20 ENCOUNTER — Encounter (HOSPITAL_BASED_OUTPATIENT_CLINIC_OR_DEPARTMENT_OTHER): Payer: Self-pay | Admitting: *Deleted

## 2013-10-20 ENCOUNTER — Encounter (HOSPITAL_BASED_OUTPATIENT_CLINIC_OR_DEPARTMENT_OTHER): Payer: 59 | Admitting: Anesthesiology

## 2013-10-20 DIAGNOSIS — X500XXA Overexertion from strenuous movement or load, initial encounter: Secondary | ICD-10-CM | POA: Insufficient documentation

## 2013-10-20 DIAGNOSIS — K219 Gastro-esophageal reflux disease without esophagitis: Secondary | ICD-10-CM | POA: Insufficient documentation

## 2013-10-20 DIAGNOSIS — M2240 Chondromalacia patellae, unspecified knee: Secondary | ICD-10-CM | POA: Insufficient documentation

## 2013-10-20 DIAGNOSIS — Z98811 Dental restoration status: Secondary | ICD-10-CM

## 2013-10-20 DIAGNOSIS — S83249A Other tear of medial meniscus, current injury, unspecified knee, initial encounter: Secondary | ICD-10-CM

## 2013-10-20 DIAGNOSIS — I10 Essential (primary) hypertension: Secondary | ICD-10-CM | POA: Diagnosis present

## 2013-10-20 DIAGNOSIS — IMO0002 Reserved for concepts with insufficient information to code with codable children: Secondary | ICD-10-CM | POA: Insufficient documentation

## 2013-10-20 DIAGNOSIS — Z6835 Body mass index (BMI) 35.0-35.9, adult: Secondary | ICD-10-CM | POA: Insufficient documentation

## 2013-10-20 DIAGNOSIS — Z01812 Encounter for preprocedural laboratory examination: Secondary | ICD-10-CM | POA: Insufficient documentation

## 2013-10-20 DIAGNOSIS — S83289A Other tear of lateral meniscus, current injury, unspecified knee, initial encounter: Secondary | ICD-10-CM | POA: Insufficient documentation

## 2013-10-20 HISTORY — DX: Dental restoration status: Z98.811

## 2013-10-20 HISTORY — PX: KNEE ARTHROSCOPY WITH MEDIAL MENISECTOMY: SHX5651

## 2013-10-20 HISTORY — DX: Other tear of medial meniscus, current injury, unspecified knee, initial encounter: S83.249A

## 2013-10-20 LAB — POCT HEMOGLOBIN-HEMACUE: Hemoglobin: 13.1 g/dL (ref 12.0–15.0)

## 2013-10-20 SURGERY — ARTHROSCOPY, KNEE, WITH MEDIAL MENISCECTOMY
Anesthesia: General | Site: Knee | Laterality: Right

## 2013-10-20 MED ORDER — SODIUM CHLORIDE 0.9 % IR SOLN
Status: DC | PRN
  Administered 2013-10-20: 6000 mL

## 2013-10-20 MED ORDER — BUPIVACAINE-EPINEPHRINE (PF) 0.5% -1:200000 IJ SOLN
INTRAMUSCULAR | Status: DC | PRN
  Administered 2013-10-20: 30 mL

## 2013-10-20 MED ORDER — BUPIVACAINE HCL (PF) 0.5 % IJ SOLN
INTRAMUSCULAR | Status: DC | PRN
  Administered 2013-10-20: 20 mL

## 2013-10-20 MED ORDER — FENTANYL CITRATE 0.05 MG/ML IJ SOLN
50.0000 ug | INTRAMUSCULAR | Status: DC | PRN
  Administered 2013-10-20: 100 ug via INTRAVENOUS

## 2013-10-20 MED ORDER — MEPERIDINE HCL 25 MG/ML IJ SOLN
6.2500 mg | INTRAMUSCULAR | Status: DC | PRN
Start: 2013-10-20 — End: 2013-10-20

## 2013-10-20 MED ORDER — PROMETHAZINE HCL 25 MG/ML IJ SOLN: 6.2500 mg | INTRAMUSCULAR | Status: DC | PRN

## 2013-10-20 MED ORDER — MIDAZOLAM HCL 2 MG/ML PO SYRP: 12.0000 mg | ORAL_SOLUTION | Freq: Once | ORAL | Status: DC | PRN

## 2013-10-20 MED ORDER — FENTANYL CITRATE 0.05 MG/ML IJ SOLN
INTRAMUSCULAR | Status: AC
  Filled 2013-10-20: qty 2

## 2013-10-20 MED ORDER — HYDROMORPHONE HCL PF 1 MG/ML IJ SOLN
0.2500 mg | INTRAMUSCULAR | Status: DC | PRN
  Administered 2013-10-20 (×2): 0.5 mg via INTRAVENOUS

## 2013-10-20 MED ORDER — CHLORHEXIDINE GLUCONATE 4 % EX LIQD
60.0000 mL | Freq: Once | CUTANEOUS | Status: DC
Start: 2013-10-20 — End: 2013-10-20

## 2013-10-20 MED ORDER — MIDAZOLAM HCL 2 MG/2ML IJ SOLN
1.0000 mg | INTRAMUSCULAR | Status: DC | PRN
  Administered 2013-10-20: 2 mg via INTRAVENOUS

## 2013-10-20 MED ORDER — MIDAZOLAM HCL 2 MG/2ML IJ SOLN
INTRAMUSCULAR | Status: AC
  Filled 2013-10-20: qty 2

## 2013-10-20 MED ORDER — PROPOFOL 10 MG/ML IV BOLUS
INTRAVENOUS | Status: AC
  Filled 2013-10-20: qty 20

## 2013-10-20 MED ORDER — OXYCODONE HCL 5 MG/5ML PO SOLN: 5.0000 mg | Freq: Once | ORAL | Status: DC | PRN

## 2013-10-20 MED ORDER — CEFAZOLIN SODIUM-DEXTROSE 2-3 GM-% IV SOLR
INTRAVENOUS | Status: AC
  Filled 2013-10-20: qty 50

## 2013-10-20 MED ORDER — OXYCODONE HCL 5 MG PO TABS: 5.0000 mg | ORAL_TABLET | Freq: Once | ORAL | Status: DC | PRN

## 2013-10-20 MED ORDER — HYDROCODONE-ACETAMINOPHEN 5-325 MG PO TABS: ORAL_TABLET | ORAL | Status: DC

## 2013-10-20 MED ORDER — BUPIVACAINE-EPINEPHRINE (PF) 0.25% -1:200000 IJ SOLN
INTRAMUSCULAR | Status: AC
  Filled 2013-10-20: qty 30

## 2013-10-20 MED ORDER — LIDOCAINE HCL (CARDIAC) 20 MG/ML IV SOLN
INTRAVENOUS | Status: DC | PRN
  Administered 2013-10-20: 100 mg via INTRAVENOUS

## 2013-10-20 MED ORDER — LIDOCAINE HCL 2 % IJ SOLN
INTRAMUSCULAR | Status: AC
  Filled 2013-10-20: qty 20

## 2013-10-20 MED ORDER — PROPOFOL 10 MG/ML IV BOLUS
INTRAVENOUS | Status: DC | PRN
  Administered 2013-10-20: 200 mg via INTRAVENOUS

## 2013-10-20 MED ORDER — FENTANYL CITRATE 0.05 MG/ML IJ SOLN
INTRAMUSCULAR | Status: AC
  Filled 2013-10-20: qty 4

## 2013-10-20 MED ORDER — CEFAZOLIN SODIUM-DEXTROSE 2-3 GM-% IV SOLR
2.0000 g | INTRAVENOUS | Status: AC
  Administered 2013-10-20: 2 g via INTRAVENOUS

## 2013-10-20 MED ORDER — LACTATED RINGERS IV SOLN
INTRAVENOUS | Status: DC
  Administered 2013-10-20 (×2): via INTRAVENOUS

## 2013-10-20 MED ORDER — MIDAZOLAM HCL 2 MG/2ML IJ SOLN: 0.5000 mg | Freq: Once | INTRAMUSCULAR | Status: DC | PRN

## 2013-10-20 MED ORDER — DEXAMETHASONE SODIUM PHOSPHATE 10 MG/ML IJ SOLN
INTRAMUSCULAR | Status: DC | PRN
  Administered 2013-10-20: 10 mg via INTRAVENOUS

## 2013-10-20 MED ORDER — EPINEPHRINE HCL 1 MG/ML IJ SOLN
INTRAMUSCULAR | Status: DC | PRN
  Administered 2013-10-20: 2 mg

## 2013-10-20 MED ORDER — HYDROMORPHONE HCL PF 1 MG/ML IJ SOLN
INTRAMUSCULAR | Status: AC
  Filled 2013-10-20: qty 1

## 2013-10-20 MED ORDER — FENTANYL CITRATE 0.05 MG/ML IJ SOLN
INTRAMUSCULAR | Status: DC | PRN
  Administered 2013-10-20: 100 ug via INTRAVENOUS

## 2013-10-20 SURGICAL SUPPLY — 44 items
BANDAGE ELASTIC 6 VELCRO ST LF (GAUZE/BANDAGES/DRESSINGS) ×2 IMPLANT
BLADE CUTTER GATOR 3.5 (BLADE) ×2 IMPLANT
BLADE GREAT WHITE 4.2 (BLADE) IMPLANT
BLADE SURG 15 STRL LF DISP TIS (BLADE) IMPLANT
BLADE SURG 15 STRL SS (BLADE)
BNDG COHESIVE 4X5 TAN STRL (GAUZE/BANDAGES/DRESSINGS) IMPLANT
CANISTER SUCT 3000ML (MISCELLANEOUS) IMPLANT
DRAPE ARTHROSCOPY W/POUCH 90 (DRAPES) ×2 IMPLANT
DRSG PAD ABDOMINAL 8X10 ST (GAUZE/BANDAGES/DRESSINGS) ×2 IMPLANT
DURAPREP 26ML APPLICATOR (WOUND CARE) ×2 IMPLANT
GAUZE SPONGE 4X4 12PLY STRL (GAUZE/BANDAGES/DRESSINGS) ×2 IMPLANT
GAUZE XEROFORM 1X8 LF (GAUZE/BANDAGES/DRESSINGS) ×2 IMPLANT
GLOVE BIO SURGEON STRL SZ7 (GLOVE) ×2 IMPLANT
GLOVE BIOGEL PI IND STRL 7.0 (GLOVE) ×2 IMPLANT
GLOVE BIOGEL PI IND STRL 7.5 (GLOVE) ×1 IMPLANT
GLOVE BIOGEL PI INDICATOR 7.0 (GLOVE) ×2
GLOVE BIOGEL PI INDICATOR 7.5 (GLOVE) ×1
GLOVE ECLIPSE 6.5 STRL STRAW (GLOVE) ×4 IMPLANT
GLOVE SS BIOGEL STRL SZ 7.5 (GLOVE) ×1 IMPLANT
GLOVE SUPERSENSE BIOGEL SZ 7.5 (GLOVE) ×1
GOWN STRL REUS W/ TWL LRG LVL3 (GOWN DISPOSABLE) ×3 IMPLANT
GOWN STRL REUS W/TWL LRG LVL3 (GOWN DISPOSABLE) ×3
HOLDER KNEE FOAM BLUE (MISCELLANEOUS) ×2 IMPLANT
KNEE WRAP E Z 3 GEL PACK (MISCELLANEOUS) ×2 IMPLANT
MANIFOLD NEPTUNE II (INSTRUMENTS) ×2 IMPLANT
NDL SAFETY ECLIPSE 18X1.5 (NEEDLE) ×2 IMPLANT
NEEDLE HYPO 18GX1.5 SHARP (NEEDLE) ×2
NEEDLE HYPO 22GX1.5 SAFETY (NEEDLE) IMPLANT
PACK ARTHROSCOPY DSU (CUSTOM PROCEDURE TRAY) ×2 IMPLANT
PACK BASIN DAY SURGERY FS (CUSTOM PROCEDURE TRAY) ×2 IMPLANT
PAD ALCOHOL SWAB (MISCELLANEOUS) IMPLANT
SET ARTHROSCOPY TUBING (MISCELLANEOUS) ×1
SET ARTHROSCOPY TUBING LN (MISCELLANEOUS) ×1 IMPLANT
SPONGE GAUZE 4X4 12PLY STER LF (GAUZE/BANDAGES/DRESSINGS) ×2 IMPLANT
SUCTION FRAZIER TIP 10 FR DISP (SUCTIONS) IMPLANT
SUT ETHILON 4 0 PS 2 18 (SUTURE) ×2 IMPLANT
SUT PROLENE 3 0 PS 2 (SUTURE) IMPLANT
SUT VIC AB 3-0 PS1 18 (SUTURE)
SUT VIC AB 3-0 PS1 18XBRD (SUTURE) IMPLANT
SYRINGE 20CC LL (MISCELLANEOUS) IMPLANT
SYRINGE 6CC (MISCELLANEOUS) ×2 IMPLANT
TOWEL OR 17X24 6PK STRL BLUE (TOWEL DISPOSABLE) ×2 IMPLANT
WAND STAR VAC 90 (SURGICAL WAND) IMPLANT
WATER STERILE IRR 1000ML POUR (IV SOLUTION) ×2 IMPLANT

## 2013-10-20 NOTE — Interval H&P Note (Signed)
History and Physical Interval Note:  10/20/2013 12:10 PM  Julie Gross  has presented today for surgery, with the diagnosis of RIGHT MEDIAL MENISCUS TEAR  The various methods of treatment have been discussed with the patient and family. After consideration of risks, benefits and other options for treatment, the patient has consented to  Procedure(s): RIGHT KNEE ARTHROSCOPY WITH MEDIAL MENISECTOMY (Right) as a surgical intervention .  The patient's history has been reviewed, patient examined, no change in status, stable for surgery.  I have reviewed the patient's chart and labs.  Questions were answered to the patient's satisfaction.     Elsie Saas A

## 2013-10-20 NOTE — Transfer of Care (Signed)
Immediate Anesthesia Transfer of Care Note  Patient: Julie Gross  Procedure(s) Performed: Procedure(s): RIGHT KNEE ARTHROSCOPY WITH MEDIAL MENISECTOMY (Right)  Patient Location: PACU  Anesthesia Type:General  Level of Consciousness: sedated  Airway & Oxygen Therapy: Patient Spontanous Breathing and Patient connected to face mask oxygen  Post-op Assessment: Report given to PACU RN and Post -op Vital signs reviewed and stable  Post vital signs: Reviewed and stable  Complications: No apparent anesthesia complications

## 2013-10-20 NOTE — Discharge Instructions (Signed)
Discharge Instructions after Knee Arthroscopy              Weight bearing as tolerated. You will have a light dressing on your knee.  Leave the dressing in place until the third day after your surgery and then remove it and place a band-aid over the stitches.  After the bandage has been removed you may shower, but do not soak the incision. You may begin gentle motion of your leg immediately after surgery. Pump your foot up and down 20 times per hour, every hour you are awake.  Apply ice to the knee 3 times per day for 30 minutes for the first 1 week until your knee is feeling comfortable again. Do not use heat.  You may begin straight leg raising exercises (if you have a brace with it on). While lying down, pull your foot all the way up, tighten your quadriceps muscle and lift your heel off of the ground. Hold this position for 2 seconds, and then let the leg back down. Repeat the exercise 10 times, at least 3 times a day.  Pain medicine has been prescribed for you.  Use your medicine as needed over the first 48 hours, and then you can begin to taper your use. You may take Extra Strength Tylenol or Tylenol only in place of the pain pills.     Please call [phone] for any problems. Including the following:  - excessive redness of the incisions - drainage for more than 4 days - fever of more than 101.5 F  *Please note that pain medications will not be refilled after hours or on weekends.  Post Anesthesia Home Care Instructions  Activity: Get plenty of rest for the remainder of the day. A responsible adult should stay with you for 24 hours following the procedure.  For the next 24 hours, DO NOT: -Drive a car -Paediatric nurse -Drink alcoholic beverages -Take any medication unless instructed by your physician -Make any legal decisions or sign important papers.  Meals: Start with liquid foods such as gelatin or soup. Progress to regular foods as tolerated. Avoid greasy, spicy,  heavy foods. If nausea and/or vomiting occur, drink only clear liquids until the nausea and/or vomiting subsides. Call your physician if vomiting continues.  Special Instructions/Symptoms: Your throat may feel dry or sore from the anesthesia or the breathing tube placed in your throat during surgery. If this causes discomfort, gargle with warm salt water. The discomfort should disappear within 24 hours.

## 2013-10-20 NOTE — Anesthesia Procedure Notes (Addendum)
Anesthesia Regional Block:  Knee block  Pre-Anesthetic Checklist: ,, timeout performed, Correct Patient, Correct Site, Correct Laterality, Correct Procedure, Correct Position, site marked, Risks and benefits discussed,  Surgical consent,  Pre-op evaluation,  At surgeon's request and post-op pain management  Laterality: Right and Lower  Prep: chloraprep       Needles:  Injection technique: Single-shot      Needle Gauge: 25 and 25 G    Additional Needles:  Procedures: other  (local infiltration of portal sites and intra-articular injection) Knee block Narrative:  Start time: 10/20/2013 11:40 AM End time: 10/20/2013 11:46 AM Injection made incrementally with aspirations every 5 mL.  Performed by: Personally  Anesthesiologist: Jenita Seashore, MD  Additional Notes: Pt identified in Holding room.  Monitors applied. Working IV access confirmed. Sterile prep R knee.  Total 20cc 0.5% bupivacaine with #25ga local infiltration to inf med and inf lateral knee.  Additional 30cc 0.5% Bupivacaine with 1:200k epi injected intra-articularly, incrementally after negative test dose.  Patient asymptomatic, VSS, no heme aspirated, tolerated well.  Jenita Seashore, MD   Procedure Name: LMA Insertion Date/Time: 10/20/2013 12:19 PM Performed by: Maryella Shivers Pre-anesthesia Checklist: Patient identified, Emergency Drugs available, Suction available and Patient being monitored Patient Re-evaluated:Patient Re-evaluated prior to inductionOxygen Delivery Method: Circle System Utilized Preoxygenation: Pre-oxygenation with 100% oxygen Intubation Type: IV induction Ventilation: Mask ventilation without difficulty LMA: LMA inserted LMA Size: 4.0 Number of attempts: 1 Airway Equipment and Method: bite block Placement Confirmation: positive ETCO2 Tube secured with: Tape Dental Injury: Teeth and Oropharynx as per pre-operative assessment

## 2013-10-20 NOTE — Anesthesia Preprocedure Evaluation (Addendum)
Anesthesia Evaluation  Patient identified by MRN, date of birth, ID band Patient awake    Reviewed: Allergy & Precautions, H&P , NPO status , Patient's Chart, lab work & pertinent test results  History of Anesthesia Complications Negative for: history of anesthetic complications  Airway Mallampati: II TM Distance: >3 FB Neck ROM: Full    Dental  (+) Dental Advisory Given   Pulmonary neg pulmonary ROS,  breath sounds clear to auscultation  Pulmonary exam normal       Cardiovascular hypertension, Pt. on medications - anginaRhythm:Regular Rate:Normal     Neuro/Psych negative neurological ROS     GI/Hepatic Neg liver ROS, GERD-  Controlled,  Endo/Other  Morbid obesity  Renal/GU negative Renal ROS     Musculoskeletal   Abdominal (+) + obese,   Peds  Hematology negative hematology ROS (+)   Anesthesia Other Findings   Reproductive/Obstetrics S/p hysterectomy                         Anesthesia Physical Anesthesia Plan  ASA: II  Anesthesia Plan: General   Post-op Pain Management:    Induction: Intravenous  Airway Management Planned: LMA  Additional Equipment:   Intra-op Plan:   Post-operative Plan:   Informed Consent: I have reviewed the patients History and Physical, chart, labs and discussed the procedure including the risks, benefits and alternatives for the proposed anesthesia with the patient or authorized representative who has indicated his/her understanding and acceptance.   Dental advisory given  Plan Discussed with: CRNA and Surgeon  Anesthesia Plan Comments: (Plan routine monitors, GA-LMA ok, knee block as per Dr. Noemi Chapel)        Anesthesia Quick Evaluation

## 2013-10-20 NOTE — Anesthesia Postprocedure Evaluation (Signed)
  Anesthesia Post-op Note  Patient: Julie Gross  Procedure(s) Performed: Procedure(s): RIGHT KNEE ARTHROSCOPY WITH MEDIAL MENISECTOMY (Right)  Patient Location: PACU  Anesthesia Type: General   Level of Consciousness: awake, alert  and oriented  Airway and Oxygen Therapy: Patient Spontanous Breathing  Post-op Pain: mild  Post-op Assessment: Post-op Vital signs reviewed  Post-op Vital Signs: Reviewed  Last Vitals:  Filed Vitals:   10/20/13 1345  BP: 127/69  Pulse: 59  Temp:   Resp: 17    Complications: No apparent anesthesia complications

## 2013-10-20 NOTE — Progress Notes (Signed)
Assisted Dr. Glennon Mac with right, knee block. Side rails up, monitors on throughout procedure. See vital signs in flow sheet. Tolerated Procedure well.

## 2013-10-21 ENCOUNTER — Encounter (HOSPITAL_BASED_OUTPATIENT_CLINIC_OR_DEPARTMENT_OTHER): Payer: Self-pay | Admitting: Orthopedic Surgery

## 2013-10-21 NOTE — Op Note (Signed)
NAMESAMONE, GUHL                 ACCOUNT NO.:  [id number]  MEDICAL RECORD NO.:  [id number]  LOCATION:                                 FACILITY:  PHYSICIAN:  Audree Camel. Noemi Chapel, M.D. DATE OF BIRTH:  05/09/1970  DATE OF PROCEDURE:  10/20/2013 DATE OF DISCHARGE:  10/20/2013                              OPERATIVE REPORT   PREOPERATIVE DIAGNOSIS:  Right knee medial and lateral meniscal tears.  POSTOPERATIVE DIAGNOSIS:  Right knee medial and lateral meniscal tears.  PROCEDURE:  Right knee exam under anesthesia followed by arthroscopic partial medial and lateral meniscectomies.  SURGEON:  Audree Camel. Noemi Chapel, MD  ASSISTANT:  Doran Stabler, PA.  ANESTHESIA:  General.  OPERATIVE TIME:  30 minutes.  COMPLICATIONS:  None.  INDICATION FOR PROCEDURE:  Julie Gross is a 43 year old woman who injured her right knee with a twisting injury approximately 4-5 weeks ago.  Exam and MRI has revealed medial meniscus tear and partial lateral meniscus tear.  She has failed conservative care with significant pain and is now to undergo arthroscopy.  DESCRIPTION:  Julie Gross was brought to the operating room on October 20, 2013, after a knee block was placed in the holding room by Anesthesia. She was placed on the operating table in supine position.  She received Avelox preoperatively for prophylaxis.  After being placed under general anesthesia, her right knee was examined.  She had full range of motion, knee was stable to ligamentous exam with normal patellar tracking.  The right leg was prepped using sterile DuraPrep and draped using sterile technique.  Time-out procedure was called and the correct right knee identified.  Initially, through an anterolateral portal, the arthroscope with a pump attached was placed into an anteromedial portal, an arthroscopic probe was placed.  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  Medial meniscus  showed tearing of the posterior horn of which 30% was resected back to a stable rim.  Intercondylar notch was inspected, anterior and posterior cruciate ligaments were normal. Lateral compartment inspected.  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  Lateral meniscus showed tearing of the posterior and lateral horn of which 30% was resected back to a stable rim. Patellofemoral joint, articular cartilage normal, the patella tracked normally.  Medial and lateral gutters were free of pathology.  After this was done, it was felt that all pathology have been satisfactorily addressed.  The instruments were removed.  Portals were closed with 3-0 nylon suture.  Sterile dressings were applied.  The patient awakened and taken to recovery room in stable condition.  FOLLOWUP CARE:  Julie Gross will be followed as an outpatient on Norco for pain.  She will be back in the office in a week for sutures out and followup.     Julie Gross A. Noemi Chapel, M.D.     RAW/MEDQ  D:  10/20/2013  T:  10/20/2013  Job:  [id number]

## 2013-12-03 ENCOUNTER — Ambulatory Visit (INDEPENDENT_AMBULATORY_CARE_PROVIDER_SITE_OTHER): Payer: 59 | Admitting: Family Medicine

## 2013-12-03 ENCOUNTER — Encounter: Payer: Self-pay | Admitting: Family Medicine

## 2013-12-03 VITALS — BP 148/102 | HR 69 | Temp 98.8°F | Resp 16 | Ht 61.5 in | Wt 220.0 lb

## 2013-12-03 DIAGNOSIS — E559 Vitamin D deficiency, unspecified: Secondary | ICD-10-CM

## 2013-12-03 DIAGNOSIS — E669 Obesity, unspecified: Secondary | ICD-10-CM

## 2013-12-03 DIAGNOSIS — Z0000 Encounter for general adult medical examination without abnormal findings: Secondary | ICD-10-CM

## 2013-12-03 DIAGNOSIS — I10 Essential (primary) hypertension: Secondary | ICD-10-CM

## 2013-12-03 LAB — POCT URINALYSIS DIPSTICK
Bilirubin, UA: NEGATIVE
GLUCOSE UA: NEGATIVE
KETONES UA: NEGATIVE
LEUKOCYTES UA: NEGATIVE
Nitrite, UA: NEGATIVE
PROTEIN UA: NEGATIVE
Spec Grav, UA: 1.015
Urobilinogen, UA: 0.2
pH, UA: 6

## 2013-12-03 MED ORDER — AMLODIPINE BESYLATE 10 MG PO TABS: 10.0000 mg | ORAL_TABLET | Freq: Every day | ORAL | Status: DC

## 2013-12-03 NOTE — Progress Notes (Signed)
Subjective:    Patient ID: Julie Gross, female    DOB: February 10, 1971, 43 y.o.   MRN: [id number]  HPI This 43 y.o. AA female is here for CPE; she has controlled HTN on Amlodipine (though BP is elevated to day). She recently underwent arthroscopic procedure on R knee; no complications w/ anesthesia or procedure. She has not been released by Dr. Noemi Chapel. Pt has Vit D def but not taking supplement other than MVI. She c/o mild fatigue, much of which she attributes to excess weight.  HCM: MMG- Current.           PAP- 2012 (negative; s/p TAH for benign reason).           IMM- Declines Flu vaccine.           Vision- Annually.   Patient Active Problem List   Diagnosis Date Noted  . Hypertension   . Dental crowns present   . Medial meniscus tear 09/23/2013  . Allergic rhinitis, cause unspecified 09/02/2013  . HTN (hypertension) 07/24/2011  . Obesity, Class II, BMI 35-39.9 07/24/2011    Prior to Admission medications   Medication Sig Start Date End Date Taking? Authorizing Provider  amLODipine (NORVASC) 10 MG tablet Take 1 tablet (10 mg total) by mouth daily.   Yes Barton Fanny, MD  cetirizine (ZYRTEC) 10 MG tablet Take 1 tablet (10 mg total) by mouth daily. 09/06/13  Yes Leandrew Koyanagi, MD  diazepam (VALIUM) 2 MG tablet Take 2 mg by mouth every 6 (six) hours as needed for anxiety.   Yes Historical Provider, MD  diclofenac (VOLTAREN) 75 MG EC tablet Take 75 mg by mouth 2 (two) times daily.   Yes Historical Provider, MD  Multiple Vitamin (MULTIVITAMIN) tablet Take 1 tablet by mouth daily.   Yes Historical Provider, MD  cholecalciferol (VITAMIN D) 1000 UNITS tablet Take 1,000 Units by mouth daily.    Historical Provider, MD  HYDROcodone-acetaminophen [facility]) 5-325 MG per tablet 1-2 tabs po q4-6 hrs prn pain 10/20/13   M. Doran Stabler, PA-C   History   Social History  . Marital Status: Divorced    Spouse Name: N/A    Number of Children: N/A  . Years of Education: N/A    Occupational History  . production [employer]    Social History Main Topics  . Smoking status: Never Smoker   . Smokeless tobacco: Never Used  . Alcohol Use: No  . Drug Use: No  . Sexual Activity: Yes    Birth Control/ Protection: Surgical   Other Topics Concern  . Not on file   Social History Narrative   Exercise - some walking, playing ball with her children    Family History  Problem Relation Age of Onset  . Hypertension Mother   . Cancer Mother 54    Breast cancer survivor  . Asthma Mother   . Hypertension Father   . Diabetes Father   . Hypertension Maternal Grandmother   . Cancer Maternal Grandmother     Intestines  . Heart disease Maternal Grandmother   . Diabetes Maternal Grandfather   . Stroke Maternal Grandfather   . Mental illness Paternal Grandmother     Alzheimer's dementia  . Cancer Paternal Grandfather     lung  . Hypertension Sister     Review of Systems  Constitutional: Positive for fatigue. Negative for fever, diaphoresis, appetite change and unexpected weight change.  HENT: Negative.   Respiratory: Negative for cough, chest tightness and shortness of breath.  Cardiovascular: Negative.   Gastrointestinal: Negative.   Endocrine: Negative.   Genitourinary: Negative.   Musculoskeletal: Positive for arthralgias, back pain and joint swelling. Negative for myalgias.  Skin: Negative.   Allergic/Immunologic: Positive for environmental allergies.  Neurological: Negative.   Hematological: Negative.   Psychiatric/Behavioral: Negative.        Objective:   Physical Exam  Nursing note and vitals reviewed. Constitutional: She is oriented to person, place, and time. She appears well-developed and well-nourished. No distress.  HENT:  Head: Normocephalic and atraumatic.  Right Ear: Hearing, tympanic membrane, external ear and ear canal normal.  Left Ear: Hearing, tympanic membrane, external ear and ear canal normal.  Nose: Nose normal. No mucosal  edema, nasal deformity or septal deviation.  Mouth/Throat: Uvula is midline, oropharynx is clear and moist and mucous membranes are normal. No oral lesions. Normal dentition. No dental caries.  Eyes: EOM and lids are normal. Pupils are equal, round, and reactive to light. No scleral icterus.  Neck: Trachea normal, normal range of motion, full passive range of motion without pain and phonation normal. Neck supple. No spinous process tenderness and no muscular tenderness present. No mass and no thyromegaly present.  Cardiovascular: Normal rate, regular rhythm, S1 normal, S2 normal, normal heart sounds and normal pulses.   No extrasystoles are present. PMI is not displaced.  Exam reveals no gallop and no friction rub.   No murmur heard. Pulmonary/Chest: Effort normal and breath sounds normal. No respiratory distress. She has no decreased breath sounds. She has no wheezes.  Abdominal: Soft. Normal appearance and bowel sounds are normal. She exhibits no distension and no mass. There is no hepatosplenomegaly. There is no tenderness. There is no guarding and no CVA tenderness.  Abdominal adipose tissue obscures palpable organs or masses.  Genitourinary:  Deferred.  Musculoskeletal:       Right shoulder: Normal.       Left shoulder: Normal.       Right knee: Tenderness found.       Left knee: Normal.       Cervical back: Normal.       Thoracic back: Normal.       Lumbar back: Normal.       Right forearm: Normal.       Left forearm: Normal.       Right hand: Normal.       Left hand: Normal.       Right lower leg: Normal.       Left lower leg: Normal.  Lymphadenopathy:       Head (right side): No submental, no submandibular, no tonsillar, no preauricular, no posterior auricular and no occipital adenopathy present.       Head (left side): No submental, no submandibular, no tonsillar, no preauricular, no posterior auricular and no occipital adenopathy present.    She has no cervical adenopathy.        Right: No inguinal and no supraclavicular adenopathy present.       Left: No inguinal and no supraclavicular adenopathy present.  Neurological: She is alert and oriented to person, place, and time. She has normal strength and normal reflexes. She displays no atrophy and no tremor. No cranial nerve deficit or sensory deficit. She exhibits normal muscle tone. She displays a negative Romberg sign. Coordination and gait normal.  Skin: Skin is warm and intact. No ecchymosis, no lesion and no rash noted. She is not diaphoretic. No cyanosis or erythema. Nails show no clubbing.  Psychiatric: She has a  normal mood and affect. Her speech is normal and behavior is normal. Judgment and thought content normal. Cognition and memory are normal.    Results for orders placed in visit on 12/03/13  POCT URINALYSIS DIPSTICK      Result Value Ref Range   Color, UA yellow     Clarity, UA clear     Glucose, UA neg     Bilirubin, UA neg     Ketones, UA neg     Spec Grav, UA 1.015     Blood, UA trace-lysed     pH, UA 6.0     Protein, UA neg     Urobilinogen, UA 0.2     Nitrite, UA neg     Leukocytes, UA Negative         Assessment & Plan:  Routine general medical examination at a health care facility - Plan: IFOBT POC (occult bld, rslt in office), POCT urinalysis dipstick  Essential hypertension- Continue Amlodipine 10 mg 1 tablet daily.  Unspecified vitamin D deficiency- Advised supplements and nutrition recommendations.  Obesity, Class II, BMI 35-39.9  Meds ordered this encounter  Medications  . amLODipine (NORVASC) 10 MG tablet    Sig: Take 1 tablet (10 mg total) by mouth daily.    Dispense:  30 tablet    Refill:  11

## 2013-12-03 NOTE — Patient Instructions (Addendum)
Keeping You Healthy  Get These Tests 1. Blood Pressure- Have your blood pressure checked once a year by your health care provider.  Normal blood pressure is 120/80. 2. Weight- Have your body mass index (BMI) calculated to screen for obesity.  BMI is measure of body fat based on height and weight.  You can also calculate your own BMI at GravelBags.it. 3. Cholesterol- Have your cholesterol checked every 5 years starting at age 45 then yearly starting at age 22. 28. Chlamydia, HIV, and other sexually transmitted diseases- Get screened every year until age 45, then within three months of each new sexual provider. 5. Pap Smear- Every 1-3 years; discuss with your health care provider. 6. Mammogram- Every year starting at age 24  Take these medicines  Calcium with Vitamin D-Your body needs 1200 mg of Calcium each day and [phone] IU of Vitamin D daily.  Your body can only absorb 500 mg of Calcium at a time so Calcium must be taken in 2 or 3 divided doses throughout the day.  Multivitamin with folic acid- Once daily if it is possible for you to become pregnant.  Get these Immunizations  Gardasil-Series of three doses; prevents HPV related illness such as genital warts and cervical cancer.  Menactra-Single dose; prevents meningitis.  Tetanus shot- Every 10 years.  Flu shot-Every year.  Take these steps 1. Do not smoke-Your healthcare provider can help you quit.  For tips on how to quit go to www.smokefree.gov or call 1-800 QUITNOW. 2. Be physically active- Exercise 5 days a week for at least 30 minutes.  If you are not already physically active, start slow and gradually work up to 30 minutes of moderate physical activity.  Examples of moderate activity include walking briskly, dancing, swimming, bicycling, etc. 3. Breast Cancer- A self breast exam every month is important for early detection of breast cancer.  For more information and instruction on self breast exams, ask your  healthcare provider or https://www.patel.info/. 4. Eat a healthy diet- Eat a variety of healthy foods such as fruits, vegetables, whole grains, low fat milk, low fat cheeses, yogurt, lean meats, poultry and fish, beans, nuts, tofu, etc.  For more information go to www. Thenutritionsource.org 5. Drink alcohol in moderation- Limit alcohol intake to one drink or less per day. Never drink and drive. 6. Depression- Your emotional health is as important as your physical health.  If you're feeling down or losing interest in things you normally enjoy please talk to your healthcare provider about being screened for depression. 7. Dental visit- Brush and floss your teeth twice daily; visit your dentist twice a year. 8. Eye doctor- Get an eye exam at least every 2 years. 9. Helmet use- Always wear a helmet when riding a bicycle, motorcycle, rollerblading or skateboarding. 44. Safe sex- If you may be exposed to sexually transmitted infections, use a condom. 11. Seat belts- Seat belts can save your live; always wear one. 12. Smoke/Carbon Monoxide detectors- These detectors need to be installed on the appropriate level of your home. Replace batteries at least once a year. 13. Skin cancer- When out in the sun please cover up and use sunscreen 15 SPF or higher. 14. Violence- If anyone is threatening or hurting you, please tell your healthcare provider.    Fish Oil supplement has good benefits for inflammation and for heart health. Get the [employer] supplement that has CoQ10 and Vitamin D. COntinue taking the multivitamin and get Vitamin D3 1000 mg and take these  supplements daily.    Vitamin D Deficiency Vitamin D is an important vitamin that your body needs. Having too little of it in your body is called a deficiency. A very bad deficiency can make your bones soft and can cause a condition called rickets.  Vitamin D is important to your body for different reasons, such as:   It  helps your body absorb 2 minerals called calcium and phosphorus.  It helps make your bones healthy.  It may prevent some diseases, such as diabetes and multiple sclerosis.  It helps your muscles and heart. You can get vitamin D in several ways. It is a natural part of some foods. The vitamin is also added to some dairy products and cereals. Some people take vitamin D supplements. Also, your body makes vitamin D when you are in the sun. It changes the sun's rays into a form of the vitamin that your body can use. CAUSES   Not eating enough foods that contain vitamin D.  Not getting enough sunlight.  Having certain digestive system diseases that make it hard to absorb vitamin D. These diseases include Crohn's disease, chronic pancreatitis, and cystic fibrosis.  Having a surgery in which part of the stomach or small intestine is removed.  Being obese. Fat cells pull vitamin D out of your blood. That means that obese people may not have enough vitamin D left in their blood and in other body tissues.  Having chronic kidney or liver disease. RISK FACTORS Risk factors are things that make you more likely to develop a vitamin D deficiency. They include:  Being older.  Not being able to get outside very much.  Living in a nursing home.  Having had broken bones.  Having weak or thin bones (osteoporosis).  Having a disease or condition that changes how your body absorbs vitamin D.  Having dark skin.  Some medicines such as seizure medicines or steroids.  Being overweight or obese. SYMPTOMS Mild cases of vitamin D deficiency may not have any symptoms. If you have a very bad case, symptoms may include:  Bone pain.  Muscle pain.  Falling often.  Broken bones caused by a minor injury, due to osteoporosis. DIAGNOSIS A blood test is the best way to tell if you have a vitamin D deficiency. TREATMENT Vitamin D deficiency can be treated in different ways. Treatment for vitamin D  deficiency depends on what is causing it. Options include:  Taking vitamin D supplements.  Taking a calcium supplement. Your caregiver will suggest what dose is best for you. HOME CARE INSTRUCTIONS  Take any supplements that your caregiver prescribes. Follow the directions carefully. Take only the suggested amount.  Have your blood tested 2 months after you start taking supplements.  Eat foods that contain vitamin D. Healthy choices include:  Fortified dairy products, cereals, or juices. Fortified means vitamin D has been added to the food. Check the label on the package to be sure.  Fatty fish like salmon or trout.  Eggs.  Oysters.  Do not use a tanning bed.  Keep your weight at a healthy level. Lose weight if you need to.  Keep all follow-up appointments. Your caregiver will need to perform blood tests to make sure your vitamin D deficiency is going away. SEEK MEDICAL CARE IF:  You have any questions about your treatment.  You continue to have symptoms of vitamin D deficiency.  You have nausea or vomiting.  You are constipated.  You feel confused.  You have  severe abdominal or back pain. MAKE SURE YOU:  Understand these instructions.  Will watch your condition.  Will get help right away if you are not doing well or get worse. Document Released: 06/04/2011 Document Revised: 07/07/2012 Document Reviewed: 06/04/2011 [facility] Patient Information 2015 [place], [place]. This information is not intended to replace advice given to you by your health care provider. Make sure you discuss any questions you have with your health care provider.    Exercise to Lose Weight Exercise and a healthy diet may help you lose weight. Your doctor may suggest specific exercises. EXERCISE IDEAS AND TIPS  Choose low-cost things you enjoy doing, such as walking, bicycling, or exercising to workout videos.  Take stairs instead of the elevator.  Walk during your lunch break.  Park  your car further away from work or school.  Go to a gym or an exercise class.  Start with 5 to 10 minutes of exercise each day. Build up to 30 minutes of exercise 4 to 6 days a week.  Wear shoes with good support and comfortable clothes.  Stretch before and after working out.  Work out until you breathe harder and your heart beats faster.  Drink extra water when you exercise.  Do not do so much that you hurt yourself, feel dizzy, or get very short of breath. Exercises that burn about 150 calories:  Running 1  miles in 15 minutes.  Playing volleyball for 45 to 60 minutes.  Washing and waxing a car for 45 to 60 minutes.  Playing touch football for 45 minutes.  Walking 1  miles in 35 minutes.  Pushing a stroller 1  miles in 30 minutes.  Playing basketball for 30 minutes.  Raking leaves for 30 minutes.  Bicycling 5 miles in 30 minutes.  Walking 2 miles in 30 minutes.  Dancing for 30 minutes.  Shoveling snow for 15 minutes.  Swimming laps for 20 minutes.  Walking up stairs for 15 minutes.  Bicycling 4 miles in 15 minutes.  Gardening for 30 to 45 minutes.  Jumping rope for 15 minutes.  Washing windows or floors for 45 to 60 minutes. Document Released: 04/14/2010 Document Revised: 06/04/2011 Document Reviewed: 04/14/2010 [facility] Patient Information 2015 [place], [place]. This information is not intended to replace advice given to you by your health care provider. Make sure you discuss any questions you have with your health care provider.

## 2013-12-03 NOTE — Progress Notes (Signed)
   Subjective:    Patient ID: Julie Gross, female    DOB: [birth year], 43 y.o.   MRN: [id number]  HPI    Review of Systems  Constitutional: Negative.   HENT: Negative.   Eyes: Negative.   Respiratory: Negative.   Cardiovascular: Negative.   Gastrointestinal: Negative.   Endocrine: Negative.   Genitourinary: Negative.   Musculoskeletal: Negative.   Skin: Negative.   Allergic/Immunologic: Negative.   Neurological: Negative.   Hematological: Negative.   Psychiatric/Behavioral: Negative.        Objective:   Physical Exam        Assessment & Plan:

## 2013-12-11 LAB — IFOBT (OCCULT BLOOD): IMMUNOLOGICAL FECAL OCCULT BLOOD TEST: NEGATIVE

## 2013-12-11 NOTE — Progress Notes (Signed)
Quick Note:  Please advise pt regarding following labs...  Recent home stool test for blood is negative. ______

## 2013-12-11 NOTE — Addendum Note (Signed)
Addended by: Yvette Rack on: 12/11/2013 08:17 AM   Modules accepted: Orders

## 2014-02-04 ENCOUNTER — Encounter: Payer: Self-pay | Admitting: Family Medicine

## 2014-02-04 ENCOUNTER — Ambulatory Visit (INDEPENDENT_AMBULATORY_CARE_PROVIDER_SITE_OTHER): Payer: 59 | Admitting: Family Medicine

## 2014-02-04 VITALS — BP 160/110 | HR 86 | Temp 98.7°F | Resp 16 | Ht 61.5 in | Wt 226.2 lb

## 2014-02-04 DIAGNOSIS — I10 Essential (primary) hypertension: Secondary | ICD-10-CM

## 2014-02-04 DIAGNOSIS — M25512 Pain in left shoulder: Secondary | ICD-10-CM

## 2014-02-04 DIAGNOSIS — Z6379 Other stressful life events affecting family and household: Secondary | ICD-10-CM

## 2014-02-04 NOTE — Patient Instructions (Signed)
Shoulder Pain  The shoulder is the joint that connects your arm to your body. Muscles and band-like tissues that connect bones to muscles (tendons) hold the joint together. Shoulder pain is felt if an injury or medical problem affects one or more parts of the shoulder.  HOME CARE   · Put ice on the sore area.  ¨ Put ice in a plastic bag.  ¨ Place a towel between your skin and the bag.  ¨ Leave the ice on for 15-20 minutes, 03-04 times a day for the first 2 days.  · Stop using cold packs if they do not help with the pain.  · If you were given something to keep your shoulder from moving (sling; shoulder immobilizer), wear it as told. Only take it off to shower or bathe.  · Move your arm as little as possible, but keep your hand moving to prevent puffiness (swelling).  · Squeeze a soft ball or foam pad as much as possible to help prevent swelling.  · Take medicine as told by your doctor.  GET HELP IF:  · You have progressing new pain in your arm, hand, or fingers.  · Your hand or fingers get cold.  · Your medicine does not help lessen your pain.  GET HELP RIGHT AWAY IF:   · Your arm, hand, or fingers are numb or tingling.  · Your arm, hand, or fingers are puffy (swollen), painful, or turn white or blue.  MAKE SURE YOU:   · Understand these instructions.  · Will watch your condition.  · Will get help right away if you are not doing well or get worse.  Document Released: 08/29/2007 Document Revised: 07/27/2013 Document Reviewed: 09/24/2011  [employer]® Patient Information ©2015 [employer]. This information is not intended to replace advice given to you by your health care provider. Make sure you discuss any questions you have with your health care provider.

## 2014-02-06 NOTE — Progress Notes (Signed)
Subjective:    Patient ID: Julie Gross, female    DOB: 1970-11-26, 43 y.o.   MRN: [id number]  HPI  This 43 y.o. AA female is here today for evaluation of L shoulder pain, onset 2 weeks ago. The pain is intermittent and located in L lateral anterior chest; it has a stabbing quality but does not radiate into her neck or across her chest. 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 There has not been any other acute trauma to neck or upper back.  Pt has NSAID which was prescribed for other joint discomfort; she has not tried taking this regularly for this pain.  She has diazepam for anxiety and can be used as a muscle relaxant also. She has taken this very sparingly. The pain has made sleep difficult but pt is dealing with stress related to her teenage son's illness.  Pt's 89 y.o. Son has been evaluated by physicians for severe abd pain; recent diagnosis is severe gastritis. Medication has just been prescribed nad son has painful episodes throughout the night that interrupt his sleep. He also has some other issues surrounding possible bullying at school. He has missed a lot of school so the pt is trying to set up home-schooling for him. This medical problem greatly stresses the pt; she partially attributes this shoulder problem to stress and lack of adequate rest.  HTN- Pt is compliant w/ medication but other factors as noted above at keeping her stress level high. She reports BP readings = 140-150/80-100.  Patient Active Problem List   Diagnosis Date Noted  . Hypertension   . Dental crowns present   . Medial meniscus tear 09/23/2013  . Allergic rhinitis, cause unspecified 09/02/2013  . HTN (hypertension) 07/24/2011  . Obesity, Class II, BMI 35-39.9 07/24/2011    Prior to Admission medications   Medication Sig Start Date End Date Taking? Authorizing Provider  amLODipine (NORVASC) 10 MG tablet Take 1 tablet (10 mg total) by mouth daily. 12/03/13   Yes Barton Fanny, MD  cetirizine (ZYRTEC) 10 MG tablet Take 1 tablet (10 mg total) by mouth daily. 09/06/13  Yes Leandrew Koyanagi, MD  cholecalciferol (VITAMIN D) 1000 UNITS tablet Take 1,000 Units by mouth daily.   Yes Historical Provider, MD  diazepam (VALIUM) 2 MG tablet Take 2 mg by mouth every 6 (six) hours as needed for anxiety.   Yes Historical Provider, MD  Multiple Vitamin (MULTIVITAMIN) tablet Take 1 tablet by mouth daily.   Yes Historical Provider, MD  diclofenac (VOLTAREN) 75 MG EC tablet Take 75 mg by mouth 2 (two) times daily.    Historical Provider, MD   History   Social History  . Marital Status: Divorced    Spouse Name: N/A    Number of Children: N/A  . Years of Education: N/A   Occupational History  . production utility    Social History Main Topics  . Smoking status: Never Smoker   . Smokeless tobacco: Never Used  . Alcohol Use: No  . Drug Use: No  . Sexual Activity: Yes    Birth Control/ Protection: Surgical   Other Topics Concern  . Not on file   Social History Narrative   Exercise - some walking, playing ball with her children    Family History  Problem Relation Age of Onset  . Hypertension Mother   . Cancer Mother 19    Breast cancer survivor  . Asthma Mother   .  Hypertension Father   . Diabetes Father   . Hypertension Maternal Grandmother   . Cancer Maternal Grandmother     Intestines  . Heart disease Maternal Grandmother   . Diabetes Maternal Grandfather   . Stroke Maternal Grandfather   . Mental illness Paternal Grandmother     Alzheimer's dementia  . Cancer Paternal Grandfather     lung  . Hypertension Sister     Review of Systems  Constitutional: Positive for activity change, appetite change and fatigue. Negative for fever and diaphoresis.  Eyes: Negative for visual disturbance.  Respiratory: Negative for cough, chest tightness and shortness of breath.   Cardiovascular: Positive for chest pain. Negative for palpitations and  leg swelling.  Gastrointestinal: Negative.   Musculoskeletal: Positive for arthralgias and neck stiffness. Negative for myalgias, back pain, gait problem and neck pain.  Skin: Negative.   Neurological: Negative.   Psychiatric/Behavioral: Positive for sleep disturbance and decreased concentration. Negative for dysphoric mood and agitation. The patient is nervous/anxious.        Objective:   Physical Exam  Constitutional: She is oriented to person, place, and time. She appears well-developed and well-nourished. No distress.  HENT:  Head: Normocephalic and atraumatic.  Right Ear: External ear normal.  Left Ear: External ear normal.  Nose: Nose normal.  Mouth/Throat: Oropharynx is clear and moist. No oropharyngeal exudate.  Eyes: Conjunctivae and EOM are normal. Pupils are equal, round, and reactive to light. No scleral icterus.  Neck: Normal range of motion. Neck supple. No thyromegaly present.  Cardiovascular: Normal rate, regular rhythm, S1 normal, S2 normal and normal heart sounds.   No extrasystoles are present. PMI is not displaced.  Exam reveals no gallop and no friction rub.   No murmur heard. Pulmonary/Chest: Effort normal and breath sounds normal. No respiratory distress. She has no decreased breath sounds. She has no wheezes. She exhibits tenderness. She exhibits no mass, no bony tenderness, no crepitus, no deformity and no swelling.  Tenderness in left subscapular area around to axilla.  Abdominal: Soft. Bowel sounds are normal. There is no tenderness.  Musculoskeletal:       Right shoulder: Normal.       Left shoulder: She exhibits tenderness. She exhibits normal range of motion, no bony tenderness, no effusion, no crepitus, no deformity, no spasm and normal strength.       Cervical back: Normal.       Thoracic back: Normal.       Lumbar back: Normal.       Arms: Lymphadenopathy:    She has no cervical adenopathy.  Neurological: She is alert and oriented to person, place,  and time. No cranial nerve deficit. She exhibits normal muscle tone. Coordination normal.  Skin: Skin is warm and dry. No rash noted. She is not diaphoretic. No erythema.  Psychiatric: Her speech is normal and behavior is normal. Judgment and thought content normal. Her mood appears anxious. Her affect is not labile and not inappropriate. Cognition and memory are normal. She does not exhibit a depressed mood.  Pt spent half of visit discussing the recent medical problems of her son; sometimes inattentive when asked questions during her exam. Focus seemed to be elsewhere.  Nursing note and vitals reviewed.      Assessment & Plan:  Pain in joint, shoulder region, left-  Muscle strain due to overuse. Take Diclofenac twice a day and continue heat and topical analgesic. Advise some work restrictions.  Stress due to illness of family member- Reassured pt  that the effectiveness of newly prescribed medications will be evident within next two weeks. Interceding on her son's behalf re: school situation will help his GI problem to improve.  Essential hypertension- Continue current medication. Take diazepam as prescribed. Try to improve sleep hygiene.  RTC if symptoms worsen.

## 2014-02-12 ENCOUNTER — Ambulatory Visit (INDEPENDENT_AMBULATORY_CARE_PROVIDER_SITE_OTHER): Payer: 59

## 2014-02-12 ENCOUNTER — Ambulatory Visit (INDEPENDENT_AMBULATORY_CARE_PROVIDER_SITE_OTHER): Payer: 59 | Admitting: Family Medicine

## 2014-02-12 ENCOUNTER — Encounter: Payer: Self-pay | Admitting: Family Medicine

## 2014-02-12 VITALS — BP 128/80 | HR 79 | Temp 98.4°F | Resp 16 | Ht 61.5 in | Wt 227.8 lb

## 2014-02-12 DIAGNOSIS — M62838 Other muscle spasm: Secondary | ICD-10-CM

## 2014-02-12 DIAGNOSIS — M25512 Pain in left shoulder: Secondary | ICD-10-CM

## 2014-02-12 DIAGNOSIS — I10 Essential (primary) hypertension: Secondary | ICD-10-CM

## 2014-02-12 DIAGNOSIS — R202 Paresthesia of skin: Secondary | ICD-10-CM

## 2014-02-12 DIAGNOSIS — M6248 Contracture of muscle, other site: Secondary | ICD-10-CM

## 2014-02-12 MED ORDER — PREDNISONE (PAK) 10 MG PO TABS: ORAL_TABLET | ORAL | Status: DC

## 2014-02-12 NOTE — Patient Instructions (Addendum)
PREDNISONE 10 mg tablets-  Day 1-  Take 6 tablets ( divide into 3 doses with meals).                                                   Day 2-  Take 5 tablets ( divide doses).                                                   Day 3-  Take 4 tablets.                                                   Day 4-  Take 3 tablets.                                                   Day 5-  Take 2 tablets.                                                   Day 6-   Take 1 tablet.  Continue other medications. You are to stay out of work.

## 2014-02-12 NOTE — Progress Notes (Signed)
Subjective:    Patient ID: Julie Gross, female    DOB: Feb 17, 1971, 43 y.o.   MRN: [id number]  HPI  This 43 y.o. AA female returns for re-assessment of L shoulder pain. She has been out of work since last visit. 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 Rest and medications have improved her condition. Pt c/o persistent burning in L scapula, and numbness in 2 middle fingers on L hand. She reports soreness and muscle spasms when turning head to L and increased discomfort w/ activity. She cannot lie on her back but is able to sleep once she finds a comfortable position.   HTN- medication compliance is excellent and pt has better readings since being out of work. She is able to address other issues at home that demand her attention. She does not feel overwhelmed. Her son's illness is improving os she is less anxious about his health.  Patient Active Problem List   Diagnosis Date Noted  . Hypertension   . Dental crowns present   . Medial meniscus tear 09/23/2013  . Allergic rhinitis, cause unspecified 09/02/2013  . HTN (hypertension) 07/24/2011  . Obesity, Class II, BMI 35-39.9 07/24/2011    Prior to Admission medications   Medication Sig Start Date End Date Taking? Authorizing Provider  amLODipine (NORVASC) 10 MG tablet Take 1 tablet (10 mg total) by mouth daily. 12/03/13  Yes Barton Fanny, MD  cetirizine (ZYRTEC) 10 MG tablet Take 1 tablet (10 mg total) by mouth daily. 09/06/13  Yes Leandrew Koyanagi, MD  cholecalciferol (VITAMIN D) 1000 UNITS tablet Take 1,000 Units by mouth daily.   Yes Historical Provider, MD  diazepam (VALIUM) 2 MG tablet Take 2 mg by mouth every 6 (six) hours as needed for anxiety.   Yes Historical Provider, MD  diclofenac (VOLTAREN) 75 MG EC tablet Take 75 mg by mouth 2 (two) times daily.   Yes Historical Provider, MD  Multiple Vitamin (MULTIVITAMIN) tablet Take 1 tablet by mouth daily.   Yes Historical Provider, MD           History   Social  History  . Marital Status: Divorced    Spouse Name: N/A    Number of Children: N/A  . Years of Education: N/A   Occupational History  . production utility    Social History Main Topics  . Smoking status: Never Smoker   . Smokeless tobacco: Never Used  . Alcohol Use: No  . Drug Use: No  . Sexual Activity: Yes    Birth Control/ Protection: Surgical   Other Topics Concern  . Not on file   Social History Narrative   Exercise - some walking, playing ball with her children    Review of Systems  Constitutional: Positive for activity change. Negative for fever and fatigue.  Eyes: Negative for visual disturbance.  Respiratory: Negative for cough, chest tightness and shortness of breath.   Cardiovascular: Positive for chest pain. Negative for palpitations.       L anterior CP around shoulder area.  Musculoskeletal: Positive for back pain and neck pain. Negative for joint swelling and gait problem.       Pt reports mild intermittent LBP.  Neurological: Negative for dizziness, tremors, weakness, light-headedness and headaches.      Objective:   Physical Exam  Constitutional: She is oriented to person, place, and time. She appears well-developed and well-nourished. No distress.  HENT:  Head: Normocephalic and atraumatic.  Right Ear: External ear normal.  Left Ear: External ear normal.  Nose: Nose normal.  Mouth/Throat: Oropharynx is clear and moist.  Eyes: Conjunctivae and EOM are normal. Pupils are equal, round, and reactive to light. No scleral icterus.  Neck: Normal range of motion. Neck supple. No thyromegaly present.  Cardiovascular: Normal rate, regular rhythm and normal heart sounds.   Pulmonary/Chest: Effort normal and breath sounds normal. No respiratory distress.  Musculoskeletal: She exhibits no edema.       Right shoulder: She exhibits tenderness and spasm. She exhibits no bony tenderness and no deformity.       Left shoulder: She exhibits tenderness. She exhibits  normal range of motion, no bony tenderness, no swelling, no crepitus, no deformity and no spasm.       Thoracic back: Normal.       Lumbar back: Normal.  Lymphadenopathy:    She has no cervical adenopathy.  Neurological: She is alert and oriented to person, place, and time. No cranial nerve deficit. She exhibits normal muscle tone. Coordination normal.  Skin: Skin is warm and dry. She is not diaphoretic.  Psychiatric: She has a normal mood and affect. Her behavior is normal. Judgment and thought content normal.  Nursing note and vitals reviewed.   [facility] reading (PRIMARY) by  Dr. 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 No fracture or abnormal alignment.     Assessment & Plan:  Pain in joint, shoulder region, left - Slow improvement; pt is out of work at present and this has allowed rest & compliance w/ medications. Trial Prednisone taper over 6 days. 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 Plan: DG Cervical Spine Complete  Essential hypertension- Stable and controlled on current medications.  Pt will remain out of work until follow-up.  Meds ordered this encounter  Medications  . predniSONE (STERAPRED UNI-PAK) 10 MG tablet    Sig: Take as directed. Take all doses with food or snack.    Dispense:  21 tablet    Refill:  0

## 2014-02-14 ENCOUNTER — Encounter: Payer: Self-pay | Admitting: Family Medicine

## 2014-02-15 ENCOUNTER — Encounter: Payer: Self-pay | Admitting: Family Medicine

## 2014-02-15 ENCOUNTER — Telehealth: Payer: Self-pay | Admitting: Family Medicine

## 2014-02-15 NOTE — Telephone Encounter (Signed)
Patient dropped off a limited wage continuation plan to be completed by Dr.McPherson. This was forwarded to 104 for completion by Dr. Leward Quan. Please return to disabilities via interoffice mail when finished.  Thank You!

## 2014-02-17 ENCOUNTER — Encounter: Payer: Self-pay | Admitting: Family Medicine

## 2014-02-17 NOTE — Telephone Encounter (Signed)
Notified patient that her paperwork is completed and at [address] for pick up.

## 2014-02-22 ENCOUNTER — Telehealth: Payer: Self-pay | Admitting: *Deleted

## 2014-02-22 NOTE — Telephone Encounter (Signed)
Faxed form.

## 2014-02-22 NOTE — Telephone Encounter (Signed)
Patient called and stated Dr. Leward Quan was filling out a "Medical Leave" form for her shoulder.   Medical Leave Form needs to be faxed to : Fax # [phone]   Attn: Medical Department

## 2014-02-26 ENCOUNTER — Encounter: Payer: Self-pay | Admitting: Family Medicine

## 2014-02-26 ENCOUNTER — Ambulatory Visit (INDEPENDENT_AMBULATORY_CARE_PROVIDER_SITE_OTHER): Payer: 59 | Admitting: Family Medicine

## 2014-02-26 VITALS — BP 140/94 | HR 98 | Temp 98.9°F | Resp 16 | Ht 61.5 in | Wt 232.0 lb

## 2014-02-26 DIAGNOSIS — I10 Essential (primary) hypertension: Secondary | ICD-10-CM

## 2014-02-26 DIAGNOSIS — S46912D Strain of unspecified muscle, fascia and tendon at shoulder and upper arm level, left arm, subsequent encounter: Secondary | ICD-10-CM

## 2014-02-26 DIAGNOSIS — J069 Acute upper respiratory infection, unspecified: Secondary | ICD-10-CM

## 2014-02-26 NOTE — Patient Instructions (Signed)
You have a follow-up later this month. Continue taking current medications.

## 2014-02-28 NOTE — Progress Notes (Signed)
Subjective:    Patient ID: Julie Gross, female    DOB: 11/15/1970, 43 y.o.   MRN: [id number]  HPI This 43 y.o. AA female returns for re-check of L shoulder strain. Pt continues to have intermittent discomfort located under shoulder blade; midl aggravation w/ certain movements (reaching up or across body, laying on L side to sleep). Completion of Prednisone w/o side effects. Continues to take cyclobenzaprine and diclofenac in addition to using moist heat. Reduction of pain is significant since temporarily out of work. She is ready to return to work for brief period; fortunately, holiday break means she will only have to work a 2-week period then have time to recover.  Pt c/o sore throat, congestion and cough x 4 days. Son has similar symptoms and evaluation at his physician included culture for Strep throat. Pt denies fever/chills, rash, HA, n/v/d or body aches. She has used some OTC allergy product for symptoms.  HTN- Pt is compliant w/ medication and BP normal since she has not been working. Stress w/ son's illness has decreased.   Patient Active Problem List   Diagnosis Date Noted  . Hypertension   . Dental crowns present   . Medial meniscus tear 09/23/2013  . Allergic rhinitis, cause unspecified 09/02/2013  . HTN (hypertension) 07/24/2011  . Obesity, Class II, BMI 35-39.9 07/24/2011    Prior to Admission medications   Medication Sig Start Date End Date Taking? Authorizing Provider  amLODipine (NORVASC) 10 MG tablet Take 1 tablet (10 mg total) by mouth daily. 12/03/13  Yes Barton Fanny, MD  cetirizine (ZYRTEC) 10 MG tablet Take 1 tablet (10 mg total) by mouth daily. 09/06/13  Yes Leandrew Koyanagi, MD  cholecalciferol (VITAMIN D) 1000 UNITS tablet Take 1,000 Units by mouth daily.   Yes Historical Provider, MD  diazepam (VALIUM) 2 MG tablet Take 2 mg by mouth every 6 (six) hours as needed for anxiety.   Yes Historical Provider, MD  diclofenac (VOLTAREN) 75 MG EC tablet Take 75  mg by mouth 2 (two) times daily.   Yes Historical Provider, MD  Multiple Vitamin (MULTIVITAMIN) tablet Take 1 tablet by mouth daily.   Yes Historical Provider, MD  ALBUTEROL IN Inhale into the lungs as needed.    Historical Provider, MD   SOC and FAM HX reviewed.  Review of Systems  Constitutional: Negative.   HENT: Positive for congestion, postnasal drip and sore throat. Negative for ear pain, facial swelling, mouth sores, rhinorrhea, sinus pressure, sneezing and trouble swallowing.   Eyes: Negative.   Respiratory: Negative.   Cardiovascular: Negative.   Musculoskeletal: Negative.   Neurological: Negative.       Objective:   Physical Exam  Constitutional: She is oriented to person, place, and time. She appears well-developed and well-nourished. No distress.  HENT:  Head: Normocephalic and atraumatic.  Right Ear: External ear normal.  Left Ear: External ear normal.  Nose: Nose normal.  Mouth/Throat: Oropharynx is clear and moist.  Eyes: Pupils are equal, round, and reactive to light. No scleral icterus.  Neck: Normal range of motion. Neck supple. No thyromegaly present.  Cardiovascular: Normal rate and regular rhythm.   Pulmonary/Chest: Effort normal and breath sounds normal. No respiratory distress. She has no wheezes.  Musculoskeletal: Normal range of motion. She exhibits no edema.       Left shoulder: She exhibits tenderness. She exhibits normal range of motion, no bony tenderness, no swelling, no deformity, no spasm and normal strength.  Tender area medial to  L scapula (rhomboid and serratus muscles).  Lymphadenopathy:    She has no cervical adenopathy.  Neurological: She is alert and oriented to person, place, and time. She has normal strength. She displays no atrophy. No cranial nerve deficit or sensory deficit. She exhibits normal muscle tone. Coordination normal.  Reflex Scores:      Tricep reflexes are 1+ on the right side and 1+ on the left side.      Bicep reflexes are  2+ on the right side and 2+ on the left side.      Brachioradialis reflexes are 2+ on the right side and 2+ on the left side. Hand grip: 4+/5.  Skin: Skin is warm and dry. No rash noted. She is not diaphoretic. No erythema.  Psychiatric: She has a normal mood and affect. Her behavior is normal. Judgment and thought content normal.  Nursing note and vitals reviewed.      Assessment & Plan:  Muscle strain of left shoulder region, subsequent encounter- Improved w/ medication, rest and symptomatic treatment.  Viral upper respiratory tract infection- Samples of Delsym for cough, Mucinex for congestion and Cepacol throat lozenges.  Essential hypertension- Stable on current medication; no change. Follow-up as scheduled.

## 2014-03-11 ENCOUNTER — Ambulatory Visit: Payer: 59 | Admitting: Family Medicine

## 2014-06-03 ENCOUNTER — Ambulatory Visit: Payer: 59 | Admitting: Family Medicine

## 2015-01-28 ENCOUNTER — Other Ambulatory Visit: Payer: Self-pay

## 2015-01-28 MED ORDER — AMLODIPINE BESYLATE 10 MG PO TABS: 10.0000 mg | ORAL_TABLET | Freq: Every day | ORAL | Status: AC

## 2016-08-15 ENCOUNTER — Ambulatory Visit (HOSPITAL_COMMUNITY)
Admission: EM | Admit: 2016-08-15 | Discharge: 2016-08-15 | Disposition: A | Discharge status: Home / Self Care | Payer: Medicaid Other | Attending: Family Medicine | Admitting: Family Medicine

## 2016-08-15 ENCOUNTER — Encounter (HOSPITAL_COMMUNITY): Payer: Self-pay | Admitting: Emergency Medicine

## 2016-08-15 DIAGNOSIS — T733XXA Exhaustion due to excessive exertion, initial encounter: Secondary | ICD-10-CM

## 2016-08-15 NOTE — ED Triage Notes (Signed)
The patient presented to the [facility] with a complaint of fatigue that started last night. The patient stated that last night she felt fatigue and then felt "shaky on the inside." The patient stated that she missed work last night as a result.

## 2016-08-15 NOTE — ED Provider Notes (Signed)
[place]    CSN: [id number] Arrival date & time: 08/15/16  1012     History   Chief Complaint Chief Complaint  Patient presents with  . Fatigue    HPI Julie Gross is a 46 y.o. female.   The patient presented to the [facility] with a complaint of fatigue that started last night. The patient stated that last night she felt fatigue and then felt "shaky on the inside." The patient stated that she missed work last night as a result.  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  Yesterday she was doing her cleaning and became more sweaty than usual area she drink plenty of fluids afterwards but never really recovered. She is unable to go to work last night because of fatigue.  Her son had a car accident this week and she's been working through that.      Past Medical History:  Diagnosis Date  . Allergy   . Dental crowns present   . Hypertension    under control with med., has been on med. since 1999  . Medial meniscus tear 09/2013   right    Patient Active Problem List   Diagnosis Date Noted  . Hypertension   . Dental crowns present   . Medial meniscus tear 09/23/2013  . Allergic rhinitis, cause unspecified 09/02/2013  . HTN (hypertension) 07/24/2011  . Obesity, Class II, BMI 35-39.9 07/24/2011    Past Surgical History:  Procedure Laterality Date  . ABDOMINAL HYSTERECTOMY  02/2011  . CYSTOSCOPY  03/12/2011   Procedure: CYSTOSCOPY;  Surgeon: Betsy Coder, MD;  Location: [facility];  Service: Gynecology;  Laterality: N/A;  . ENDOMETRIAL ABLATION  2002  . KNEE ARTHROSCOPY W/ ACL RECONSTRUCTION Left 2006  . KNEE ARTHROSCOPY WITH MEDIAL MENISECTOMY Right 10/20/2013   Procedure: RIGHT KNEE ARTHROSCOPY WITH MEDIAL MENISECTOMY;  Surgeon: Lorn Junes, MD;  Location: [facility];  Service:  Orthopedics;  Laterality: Right;  . LAPAROSCOPIC HYSTERECTOMY  03/12/2011   Procedure: HYSTERECTOMY TOTAL LAPAROSCOPIC;  Surgeon: Betsy Coder, MD;  Location: [facility];  Service: Gynecology;  Laterality: N/A;  . TONSILLECTOMY  2009  . TUBAL LIGATION  2002    OB History    No data available       Home Medications    Prior to Admission medications   Medication Sig Start Date End Date Taking? Authorizing Provider  amLODipine (NORVASC) 10 MG tablet Take 1 tablet (10 mg total) by mouth daily. PATIENT NEEDS OFFICE VISIT FOR ADDITIONAL REFILLS 01/28/15  Yes Weber, Damaris Hippo, PA-C    Family History Family History  Problem Relation Age of Onset  . Hypertension Mother   . Cancer Mother 22       Breast cancer survivor  . Asthma Mother   . Hypertension Father   . Diabetes Father   . Hypertension Maternal Grandmother   . Cancer Maternal Grandmother        Intestines  . Heart disease Maternal Grandmother   . Diabetes Maternal Grandfather   . Stroke Maternal Grandfather   . Mental illness Paternal Grandmother        Alzheimer's dementia  . Cancer Paternal Grandfather        lung  . Hypertension Sister     Social History Social History  Substance Use Topics  . Smoking status: Never Smoker  . Smokeless tobacco: Never Used  . Alcohol use No     Allergies   Lisinopril; Noroxin [norfloxacin]; and Clarinex [desloratadine]   Review of Systems Review of Systems  Constitutional: Positive for fatigue.  All other systems reviewed and are negative.    Physical Exam Triage Vital Signs ED Triage Vitals  Enc Vitals Group     BP 08/15/16 1046 (!) 151/86     Pulse Rate 08/15/16 1046 67     Resp 08/15/16 1046 18     Temp 08/15/16 1046 98.5 F (36.9 C)     Temp Source 08/15/16 1046 Oral     SpO2 08/15/16 1046 100 %     Weight --      Height --      Head Circumference --      Peak Flow --      Pain Score 08/15/16 1044 0     Pain Loc --      Pain Edu? --      Excl. in [place]? --     No data found.   Updated Vital Signs BP (!) 151/86 (BP Location: Right Arm)   Pulse 67   Temp 98.5 F (36.9 C) (Oral)   Resp 18   LMP 02/13/2011   SpO2 100%    Physical Exam  Constitutional: She is oriented to person, place, and time. She appears well-developed and well-nourished.  HENT:  Right Ear: External ear normal.  Left Ear: External ear normal.  Mouth/Throat: Oropharynx is clear and moist.  Eyes: Conjunctivae are normal. Pupils are equal, round, and reactive to light.  Neck: Normal range of motion. Neck supple.  Cardiovascular: Normal rate, regular rhythm and normal heart sounds.   Pulmonary/Chest: Effort normal and breath sounds normal.  Abdominal: Soft. There is no tenderness.  Musculoskeletal: Normal range of motion.  Neurological: She is alert and oriented to person, place, and time.  Skin: Skin is warm and dry.  Nursing note and vitals reviewed.    UC Treatments / Results  Labs (all labs ordered are listed, but only abnormal results are displayed) Labs Reviewed - No data to display  EKG  EKG Interpretation None       Radiology No results found.  Procedures Procedures (including critical care time)  Medications Ordered in UC Medications - No data to display   Initial Impression / Assessment and Plan / UC Course  I have reviewed the triage vital signs and the nursing notes.  Pertinent labs & imaging results that were available during my care of the patient were reviewed by me and considered in my medical decision making (see chart for details).     Final Clinical Impressions(s) / UC Diagnoses   Final diagnoses:  Fatigue due to excessive exertion, initial encounter    New Prescriptions New Prescriptions   No medications on file     Robyn Haber, MD 08/15/16 1118

## 2016-08-17 ENCOUNTER — Encounter (HOSPITAL_COMMUNITY): Payer: Self-pay | Admitting: Emergency Medicine

## 2016-08-17 ENCOUNTER — Emergency Department (HOSPITAL_COMMUNITY)
Admission: EM | Admit: 2016-08-17 | Discharge: 2016-08-17 | Disposition: A | Discharge status: Home / Self Care | Payer: Medicaid Other | Attending: Dermatology | Admitting: Dermatology

## 2016-08-17 DIAGNOSIS — R5383 Other fatigue: Secondary | ICD-10-CM | POA: Insufficient documentation

## 2016-08-17 NOTE — ED Notes (Signed)
Pt called for room assignment. Pt did not answer and was advised by registration that patient had left.

## 2016-08-17 NOTE — ED Triage Notes (Signed)
Pt reports having feeling of "shaking internally" for the last several days. Pt states that she was seen at urgent care for same complaints and was diagnosed with fatigue. Pt reports multiple stressors at home and work. Pt denies any chest pain or shortness of breath.

## 2018-10-25 ENCOUNTER — Other Ambulatory Visit: Payer: Self-pay

## 2018-10-25 ENCOUNTER — Emergency Department
Admission: EM | Admit: 2018-10-25 | Discharge: 2018-10-25 | Disposition: A | Discharge status: Home / Self Care | Payer: Self-pay | Attending: Emergency Medicine | Admitting: Emergency Medicine

## 2018-10-25 DIAGNOSIS — T304 Corrosion of unspecified body region, unspecified degree: Secondary | ICD-10-CM | POA: Insufficient documentation

## 2018-10-25 DIAGNOSIS — Z79899 Other long term (current) drug therapy: Secondary | ICD-10-CM | POA: Insufficient documentation

## 2018-10-25 DIAGNOSIS — I10 Essential (primary) hypertension: Secondary | ICD-10-CM | POA: Insufficient documentation

## 2018-10-25 MED ORDER — DEXAMETHASONE SODIUM PHOSPHATE 10 MG/ML IJ SOLN
10.0000 mg | Freq: Once | INTRAMUSCULAR | Status: AC
  Administered 2018-10-25: 10 mg via INTRAVENOUS
  Filled 2018-10-25: qty 1

## 2018-10-25 MED ORDER — HYDROCORTISONE 0.5 % EX OINT: 1.0000 "application " | TOPICAL_OINTMENT | Freq: Three times a day (TID) | CUTANEOUS | 0 refills | Status: AC

## 2018-10-25 MED ORDER — BACITRACIN ZINC 500 UNIT/GM EX OINT
TOPICAL_OINTMENT | Freq: Once | CUTANEOUS | Status: AC
  Administered 2018-10-25: 1 via TOPICAL
  Filled 2018-10-25 (×2): qty 0.9

## 2018-10-25 MED ORDER — KETOROLAC TROMETHAMINE 30 MG/ML IJ SOLN
15.0000 mg | Freq: Once | INTRAMUSCULAR | Status: AC
  Administered 2018-10-25: 15 mg via INTRAVENOUS
  Filled 2018-10-25: qty 1

## 2018-10-25 NOTE — ED Triage Notes (Signed)
Pt arrived via POV with reports of possible chemical exposure today while doing some housework at her mothers house. Pt states her mother lives in an older house and was using some shoe molding and caulk when she started noticed a burning sensation to face that progressively worsened. Pt states she tried cleaning her face, using neosporin and states the burning improved, but then improved.   On arrival pt was anxious, unable to sit still, pt taken directly to decon shower and cleansed face with cleaner and rinsed under water for about 10-15 minutes. Pt reports improvement in sxs.  Pt states she has used the same chemicals before with no problem.  No respiratory distress noted

## 2018-10-25 NOTE — ED Provider Notes (Signed)
[facility] Emergency Department Provider Note  ____________________________________________   First MD Initiated Contact with Patient 10/25/18 1748     (approximate)  I have reviewed the triage vital signs and the nursing notes.   HISTORY  Chief Complaint Chemical Exposure    HPI Julie Gross is a 48 y.o. female  Here with facial pain/redness.  Patient was at her mother's house today helping do some repairs.  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  She states that she was using a saw to cut through crown molding.  She denies being struck in the face with anything.  It was painted crown molding.  She states that shortly after this, she began experiencing burning sensation on her forehead and bilateral cheeks.  She tried to apply cold cloth without improvement.  The symptoms progressively worsened over the next hour, so she presents for evaluation.  Denies any eye pain, blurred vision, or vision changes. No lip pain or swelling. No itching. No hives. No other complaints. Has worked w/ molding without difficulty in past, but not the currently supply (just bought it). No cough, SOB, r wheezing.       Past Medical History:  Diagnosis Date  . Allergy   . Dental crowns present   . Hypertension    under control with med., has been on med. since 1999  . Medial meniscus tear 09/2013   right    Patient Active Problem List   Diagnosis Date Noted  . Hypertension   . Dental crowns present   . Medial meniscus tear 09/23/2013  . Allergic rhinitis, cause unspecified 09/02/2013  . HTN (hypertension) 07/24/2011  . Obesity, Class II, BMI 35-39.9 07/24/2011    Past Surgical History:  Procedure Laterality Date  . ABDOMINAL HYSTERECTOMY  02/2011  . CYSTOSCOPY  03/12/2011   Procedure: CYSTOSCOPY;  Surgeon: Betsy Coder, MD;  Location: [facility];  Service: Gynecology;  Laterality: N/A;  . ENDOMETRIAL ABLATION  2002  . KNEE ARTHROSCOPY W/ ACL RECONSTRUCTION Left  2006  . KNEE ARTHROSCOPY WITH MEDIAL MENISECTOMY Right 10/20/2013   Procedure: RIGHT KNEE ARTHROSCOPY WITH MEDIAL MENISECTOMY;  Surgeon: Lorn Junes, MD;  Location: [facility];  Service: Orthopedics;  Laterality: Right;  . LAPAROSCOPIC HYSTERECTOMY  03/12/2011   Procedure: HYSTERECTOMY TOTAL LAPAROSCOPIC;  Surgeon: Betsy Coder, MD;  Location: [facility];  Service: Gynecology;  Laterality: N/A;  . TONSILLECTOMY  2009  . TUBAL LIGATION  2002    Prior to Admission medications   Medication Sig Start Date End Date Taking? Authorizing Provider  amLODipine (NORVASC) 10 MG tablet Take 1 tablet (10 mg total) by mouth daily. PATIENT NEEDS OFFICE VISIT FOR ADDITIONAL REFILLS 01/28/15   Mancel Bale, PA-C  hydrocortisone ointment 0.5 % Apply 1 application topically 3 (three) times daily for 5 days. 10/25/18 10/30/18  Duffy Bruce, MD    Allergies Lisinopril, Desloratadine, and Norfloxacin  Family History  Problem Relation Age of Onset  . Hypertension Mother   . Cancer Mother 30       Breast cancer survivor  . Asthma Mother   . Hypertension Father   . Diabetes Father   . Hypertension Maternal Grandmother   . Cancer Maternal Grandmother        Intestines  . Heart disease Maternal Grandmother   . Diabetes Maternal Grandfather   . Stroke Maternal Grandfather   . Mental illness Paternal Grandmother        Alzheimer's dementia  .  Cancer Paternal Grandfather        lung  . Hypertension Sister     Social History Social History   Tobacco Use  . Smoking status: Never Smoker  . Smokeless tobacco: Never Used  Substance Use Topics  . Alcohol use: No  . Drug use: No    Review of Systems  Review of Systems  Constitutional: Negative for fatigue and fever.  HENT: Positive for facial swelling. Negative for congestion and sore throat.   Eyes: Negative for visual disturbance.  Respiratory: Negative for cough, shortness of breath and wheezing.   Cardiovascular: Negative for  chest pain.  Gastrointestinal: Negative for abdominal pain, diarrhea, nausea and vomiting.  Genitourinary: Negative for flank pain.  Musculoskeletal: Negative for back pain and neck pain.  Skin: Negative for rash and wound.  Neurological: Negative for weakness.  All other systems reviewed and are negative.    ____________________________________________  PHYSICAL EXAM:      VITAL SIGNS: ED Triage Vitals  Enc Vitals Group     BP 10/25/18 1736 (!) 163/88     Pulse Rate 10/25/18 1736 77     Resp 10/25/18 1736 20     Temp 10/25/18 1737 98.1 F (36.7 C)     Temp Source 10/25/18 1736 Oral     SpO2 10/25/18 1736 99 %     Weight 10/25/18 1737 210 lb (95.3 kg)     Height 10/25/18 1737 5\' 1"  (1.549 m)     Head Circumference --      Peak Flow --      Pain Score 10/25/18 1737 6     Pain Loc --      Pain Edu? --      Excl. in [place]? --      Physical Exam Vitals signs and nursing note reviewed.  Constitutional:      General: She is not in acute distress.    Appearance: She is well-developed.  HENT:     Head: Normocephalic and atraumatic.     Comments: Mild erythema and swelling noted to forehead, bilateral maxillary cheeks. Eyes without conjunctival injection, drainage, or other abnormality. No lip or tongue swelling.  Eyes:     Conjunctiva/sclera: Conjunctivae normal.  Neck:     Musculoskeletal: Neck supple.  Cardiovascular:     Rate and Rhythm: Normal rate and regular rhythm.     Heart sounds: Normal heart sounds.  Pulmonary:     Effort: Pulmonary effort is normal. No respiratory distress.     Breath sounds: No wheezing.  Abdominal:     General: There is no distension.  Skin:    General: Skin is warm.     Capillary Refill: Capillary refill takes less than 2 seconds.     Findings: No rash.  Neurological:     Mental Status: She is alert and oriented to person, place, and time.     Motor: No abnormal muscle tone.       ____________________________________________    LABS (all labs ordered are listed, but only abnormal results are displayed)  Labs Reviewed - No data to display  ____________________________________________  EKG: None ________________________________________  RADIOLOGY All imaging, including plain films, CT scans, and ultrasounds, independently reviewed by me, and interpretations confirmed via formal radiology reads.  ED MD interpretation:   None  Official radiology report(s): No results found.  ____________________________________________  PROCEDURES   Procedure(s) performed (including Critical Care):  Procedures  ____________________________________________  INITIAL IMPRESSION / MDM / ASSESSMENT AND PLAN / ED COURSE  As part of my medical decision making, I reviewed the following data within the electronic MEDICAL RECORD NUMBER Notes from prior ED visits and [place] Controlled Substance Database      *Julie Gross was evaluated in Emergency Department on 10/25/2018 for the symptoms described in the history of present illness. She was evaluated in the context of the global COVID-19 pandemic, which necessitated consideration that the patient might be at risk for infection with the SARS-CoV-2 virus that causes COVID-19. Institutional protocols and algorithms that pertain to the evaluation of patients at risk for COVID-19 are in a state of rapid change based on information released by regulatory bodies including the CDC and federal and state organizations. These policies and algorithms were followed during the patient's care in the ED.  Some ED evaluations and interventions may be delayed as a result of limited staffing during the pandemic.*   Clinical Course as of Oct 24 1916  Sat Oct 25, 2018  1916 48 yo F here with suspected mild chemical irritation/irritant dermatitis to face. Likely 2/2 fumes released while sawing molding. No debris noted and there does not appear to be any debris, eye involvement. She has no lip swelling, tongue  swelling, or signs of oral involvement No urticaria or signs of allergic reaction or angiedema. Will treat with topical steroid (low potency, low duration 2/2 face involvement) and d/c with outpt follow-up.   [CI]    Clinical Course User Index [CI] Duffy Bruce, MD    Medical Decision Making: As above.  ____________________________________________  FINAL CLINICAL IMPRESSION(S) / ED DIAGNOSES  Final diagnoses:  Chemical burn     MEDICATIONS GIVEN DURING THIS VISIT:  Medications  dexamethasone (DECADRON) injection 10 mg (10 mg Intravenous Given 10/25/18 1854)  ketorolac (TORADOL) 30 MG/ML injection 15 mg (15 mg Intravenous Given 10/25/18 1852)  bacitracin ointment (1 application Topical Given 10/25/18 1857)     ED Discharge Orders         Ordered    hydrocortisone ointment 0.5 %  3 times daily     10/25/18 1911           Note:  This document was prepared using Dragon voice recognition software and may include unintentional dictation errors.   Duffy Bruce, MD 10/25/18 [phone]

## 2018-10-25 NOTE — ED Notes (Signed)
Peripheral IV discontinued. Catheter intact. No signs of infiltration or redness. Gauze applied to IV site.    Discharge instructions reviewed with patient. Questions fielded by this RN. Patient verbalizes understanding of instructions. Patient discharged home in stable condition per issacs. No acute distress noted at time of discharge.

## 2018-10-25 NOTE — Discharge Instructions (Signed)
As we discussed, apply the topical hydrocortisone ointment 3 times a day for the next 2 to 3 days.  Then, I recommend an over-the-counter, unscented petroleum jelly or other mild ointment to help with hydration and healing.  Avoid the direct sun.  I would not cut or use the molding that you had purchased previously.  Take over-the-counter Tylenol and Advil as needed.

## 2018-11-17 ENCOUNTER — Other Ambulatory Visit: Payer: Self-pay

## 2018-11-17 DIAGNOSIS — Z20822 Contact with and (suspected) exposure to covid-19: Secondary | ICD-10-CM

## 2018-11-18 LAB — NOVEL CORONAVIRUS, NAA: SARS-CoV-2, NAA: NOT DETECTED

## 2019-02-02 ENCOUNTER — Other Ambulatory Visit: Payer: Self-pay

## 2019-02-02 DIAGNOSIS — Z20822 Contact with and (suspected) exposure to covid-19: Secondary | ICD-10-CM

## 2019-02-03 LAB — NOVEL CORONAVIRUS, NAA: SARS-CoV-2, NAA: NOT DETECTED

## 2019-03-03 ENCOUNTER — Other Ambulatory Visit: Payer: Self-pay

## 2019-03-03 DIAGNOSIS — Z20822 Contact with and (suspected) exposure to covid-19: Secondary | ICD-10-CM

## 2019-03-05 LAB — NOVEL CORONAVIRUS, NAA: SARS-CoV-2, NAA: NOT DETECTED

## 2020-02-06 ENCOUNTER — Other Ambulatory Visit: Payer: Self-pay

## 2020-02-06 DIAGNOSIS — Z20822 Contact with and (suspected) exposure to covid-19: Secondary | ICD-10-CM

## 2020-02-07 LAB — SARS-COV-2, NAA 2 DAY TAT

## 2020-02-07 LAB — NOVEL CORONAVIRUS, NAA: SARS-CoV-2, NAA: NOT DETECTED

## 2020-09-17 ENCOUNTER — Encounter (HOSPITAL_COMMUNITY): Payer: Self-pay | Admitting: Emergency Medicine

## 2020-09-17 ENCOUNTER — Ambulatory Visit (HOSPITAL_COMMUNITY)
Admission: EM | Admit: 2020-09-17 | Discharge: 2020-09-17 | Disposition: A | Discharge status: Home / Self Care | Payer: BC Managed Care – PPO

## 2020-09-17 ENCOUNTER — Ambulatory Visit (INDEPENDENT_AMBULATORY_CARE_PROVIDER_SITE_OTHER): Payer: BC Managed Care – PPO

## 2020-09-17 ENCOUNTER — Other Ambulatory Visit: Payer: Self-pay

## 2020-09-17 DIAGNOSIS — I10 Essential (primary) hypertension: Secondary | ICD-10-CM

## 2020-09-17 DIAGNOSIS — M25472 Effusion, left ankle: Secondary | ICD-10-CM

## 2020-09-17 DIAGNOSIS — M25572 Pain in left ankle and joints of left foot: Secondary | ICD-10-CM

## 2020-09-17 NOTE — ED Provider Notes (Signed)
[place]   MRN: [id number] DOB: 12-Dec-1970  Subjective:   Julie Gross is a 50 y.o. female presenting for acute onset this morning of left lateral ankle pain with swelling.  Patient states she noticed some pain on Wednesday but today woke up and was severe, has significant difficulty bearing weight on it.  Denies fall, trauma, loss of sensation, bruising, warmth, history of gout or arthritis.    No current facility-administered medications for this encounter.  Current Outpatient Medications:    valACYclovir (VALTREX) 500 MG tablet, valacyclovir 500 mg tablet  TAKE 1 TABLET TWICE DAILY X 3 DAYS FOR AN OUTBREAK AND ONE TABLET DAILY AFTER THIS FOR SUPPRESSION., Disp: , Rfl:    amLODipine (NORVASC) 10 MG tablet, Take 1 tablet (10 mg total) by mouth daily. PATIENT NEEDS OFFICE VISIT FOR ADDITIONAL REFILLS, Disp: 30 tablet, Rfl: 0   olmesartan (BENICAR) 20 MG tablet, Take 20 mg by mouth daily., Disp: , Rfl:    Vitamin D, Ergocalciferol, (DRISDOL) 1.25 MG (50000 UNIT) CAPS capsule, TAKE 1 CAPSULE (50,000 UNITS DOSE) BY MOUTH ONCE A WEEK FOR 12 DOSES, Disp: , Rfl:    Allergies  Allergen Reactions   Lisinopril Shortness Of Breath    SLEEPINESS   Desloratadine Rash   Norfloxacin Other (See Comments)    PAIN IN JOINTS, SWELLING OF TONGUE, NEAR-SYNCOPE    Past Medical History:  Diagnosis Date   Allergy    Dental crowns present    Hypertension    under control with med., has been on med. since 1999   Medial meniscus tear 09/2013   right     Past Surgical History:  Procedure Laterality Date   ABDOMINAL HYSTERECTOMY  02/2011   CYSTOSCOPY  03/12/2011   Procedure: CYSTOSCOPY;  Surgeon: Betsy Coder, MD;  Location: [facility];  Service: Gynecology;  Laterality: N/A;   ENDOMETRIAL ABLATION  2002   KNEE ARTHROSCOPY W/ ACL RECONSTRUCTION Left 2006   KNEE ARTHROSCOPY WITH MEDIAL MENISECTOMY Right 10/20/2013   Procedure: RIGHT KNEE ARTHROSCOPY WITH MEDIAL MENISECTOMY;   Surgeon: Lorn Junes, MD;  Location: [facility];  Service: Orthopedics;  Laterality: Right;   LAPAROSCOPIC HYSTERECTOMY  03/12/2011   Procedure: HYSTERECTOMY TOTAL LAPAROSCOPIC;  Surgeon: Betsy Coder, MD;  Location: [facility];  Service: Gynecology;  Laterality: N/A;   TONSILLECTOMY  2009   TUBAL LIGATION  2002    Family History  Problem Relation Age of Onset   Hypertension Mother    Cancer Mother 30       Breast cancer survivor   Asthma Mother    Hypertension Father    Diabetes Father    Hypertension Maternal Grandmother    Cancer Maternal Grandmother        Intestines   Heart disease Maternal Grandmother    Diabetes Maternal Grandfather    Stroke Maternal Grandfather    Mental illness Paternal Grandmother        Alzheimer's dementia   Cancer Paternal Grandfather        lung   Hypertension Sister     Social History   Tobacco Use   Smoking status: Never   Smokeless tobacco: Never  Substance Use Topics   Alcohol use: No   Drug use: No    ROS   Objective:   Vitals: BP (!) 166/87 (BP Location: Right Arm)   Pulse 63   Temp 98.7 F (37.1 C) (Oral)   Resp 18   LMP 02/13/2011  SpO2 99%   Physical Exam Constitutional:      General: She is not in acute distress.    Appearance: Normal appearance. She is well-developed. She is not ill-appearing, toxic-appearing or diaphoretic.  HENT:     Head: Normocephalic and atraumatic.     Nose: Nose normal.     Mouth/Throat:     Mouth: Mucous membranes are moist.     Pharynx: Oropharynx is clear.  Eyes:     General: No scleral icterus.    Extraocular Movements: Extraocular movements intact.     Pupils: Pupils are equal, round, and reactive to light.  Cardiovascular:     Rate and Rhythm: Normal rate.  Pulmonary:     Effort: Pulmonary effort is normal.  Musculoskeletal:     Left ankle: Swelling present. No deformity, ecchymosis or lacerations. Tenderness present over the lateral malleolus and AITF  ligament. No medial malleolus, ATF ligament, CF ligament, posterior TF ligament, base of 5th metatarsal or proximal fibula tenderness. Decreased range of motion.     Left Achilles Tendon: No tenderness or defects. Thompson's test negative.     Left foot: Normal range of motion and normal capillary refill. No swelling, deformity, laceration, tenderness, bony tenderness or crepitus.  Skin:    General: Skin is warm and dry.  Neurological:     General: No focal deficit present.     Mental Status: She is alert and oriented to person, place, and time.     Motor: No weakness.     Coordination: Coordination normal.     Gait: Gait normal.     Deep Tendon Reflexes: Reflexes normal.  Psychiatric:        Mood and Affect: Mood normal.        Behavior: Behavior normal.        Thought Content: Thought content normal.        Judgment: Judgment normal.    DG Ankle Complete Left  Result Date: 09/17/2020 CLINICAL DATA:  Swelling.  Pain. EXAM: LEFT ANKLE COMPLETE - 3+ VIEW COMPARISON:  None. FINDINGS: Soft tissue swelling, particularly laterally. The ankle mortise is intact. No fractures are identified. No other acute abnormalities. IMPRESSION: No bony abnormalities.  Soft tissue swelling. Electronically Signed   By: Dorise Bullion III M.D   On: 09/17/2020 13:51     Assessment and Plan :   PDMP not reviewed this encounter.  1. Pain and swelling of left ankle   2. Essential hypertension     Undifferentiated ankle pain and swelling.  We will address for inflammatory process with RICE method.  Due to her elevated blood pressures and risk factors associated with use of NSAIDs will avoid this.  Recommended Tylenol.  Follow-up with PCP as soon as possible. Counseled patient on potential for adverse effects with medications prescribed/recommended today, ER and return-to-clinic precautions discussed, patient verbalized understanding.    Jaynee Eagles, [place] 09/17/20 1445

## 2020-09-17 NOTE — Discharge Instructions (Addendum)
Please just use Tylenol at a dose of 500mg-650mg once every 6 hours as needed for your aches, pains, fevers. Do not use any nonsteroidal anti-inflammatories (NSAIDs) like ibuprofen, Motrin, naproxen, Aleve, etc. which are all available over-the-counter.   

## 2020-09-17 NOTE — ED Triage Notes (Signed)
Pt is present today with left ankle pain and swelling.  Pt states that she noticed the pain Wednesday. Pt states that she cannot bare weight down on her foot without feeling a sharp pain.

## 2021-10-02 IMAGING — DX DG ANKLE COMPLETE 3+V*L*
3 series · 3 of 3 positions shown · non-contrast
Comparison: None.

CLINICAL DATA: Swelling.  Pain.

EXAM:
LEFT ANKLE COMPLETE - 3+ VIEW

[ankle ap]
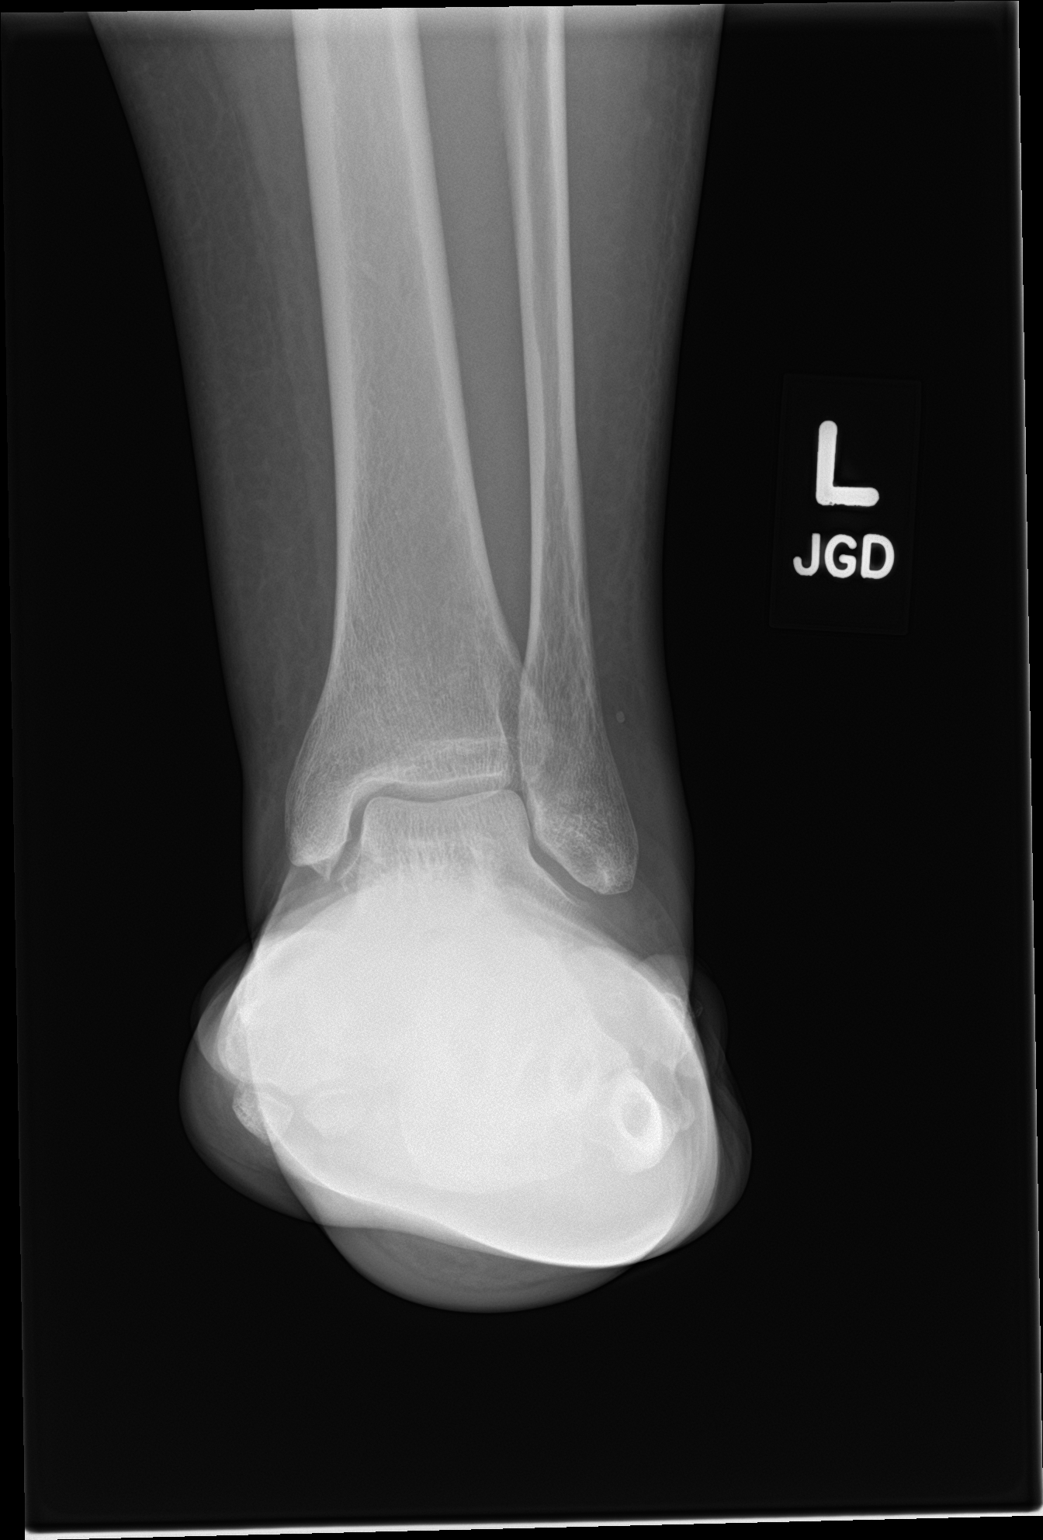

[ankle obl]
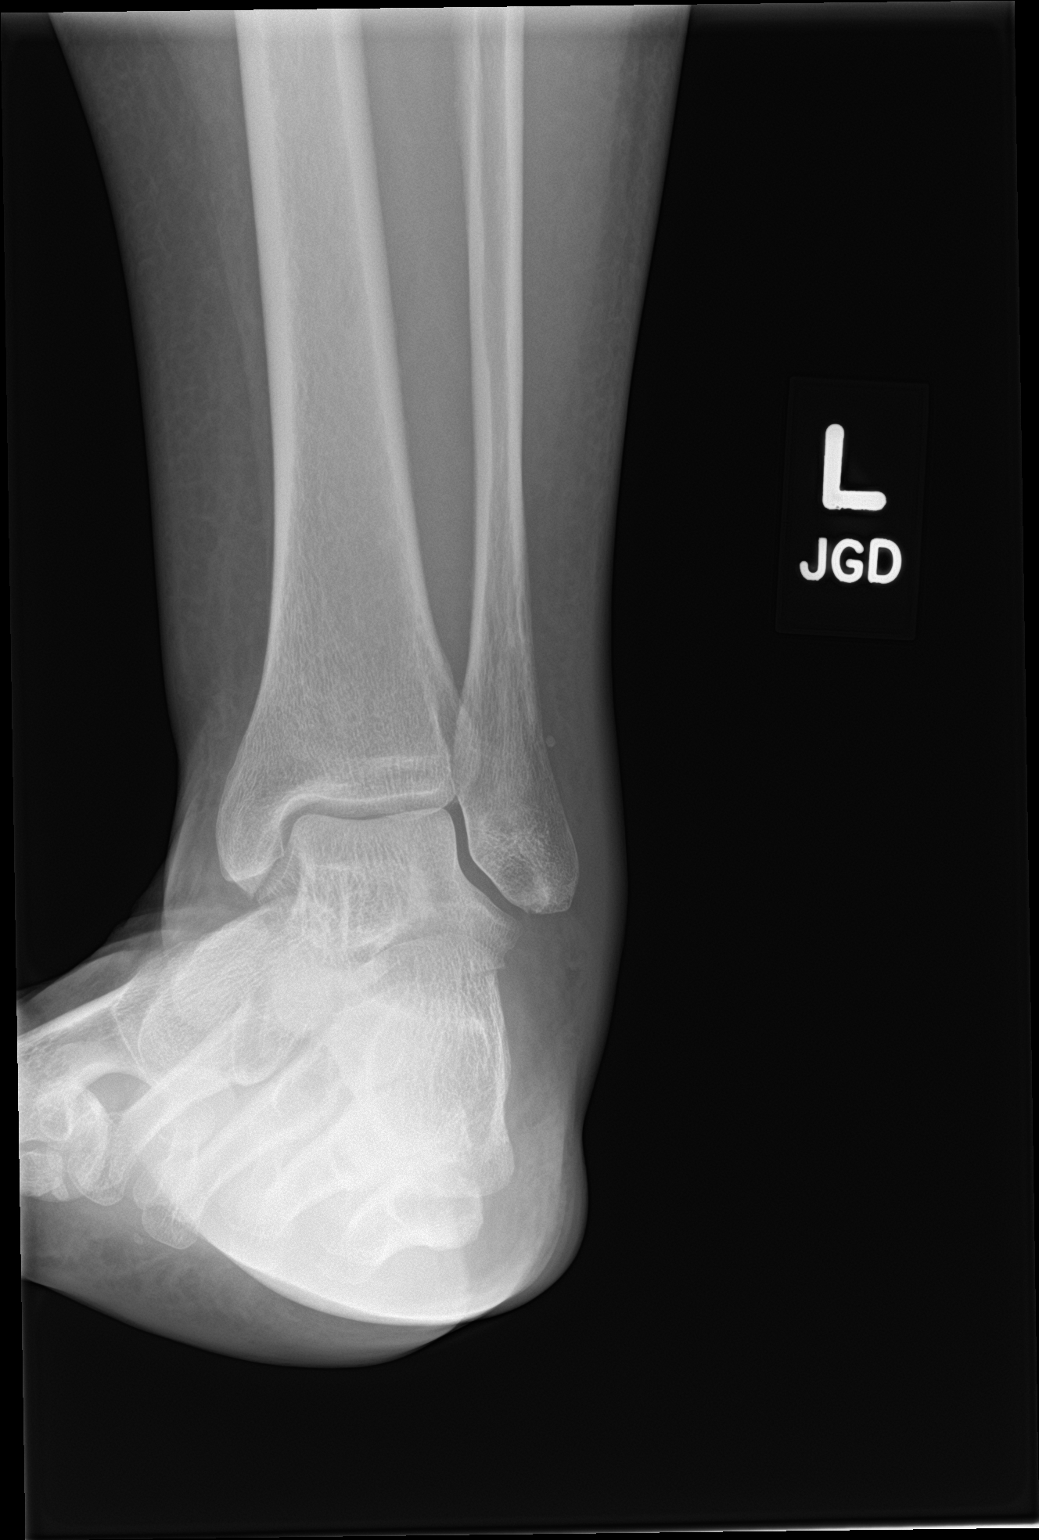

[ankle lat]
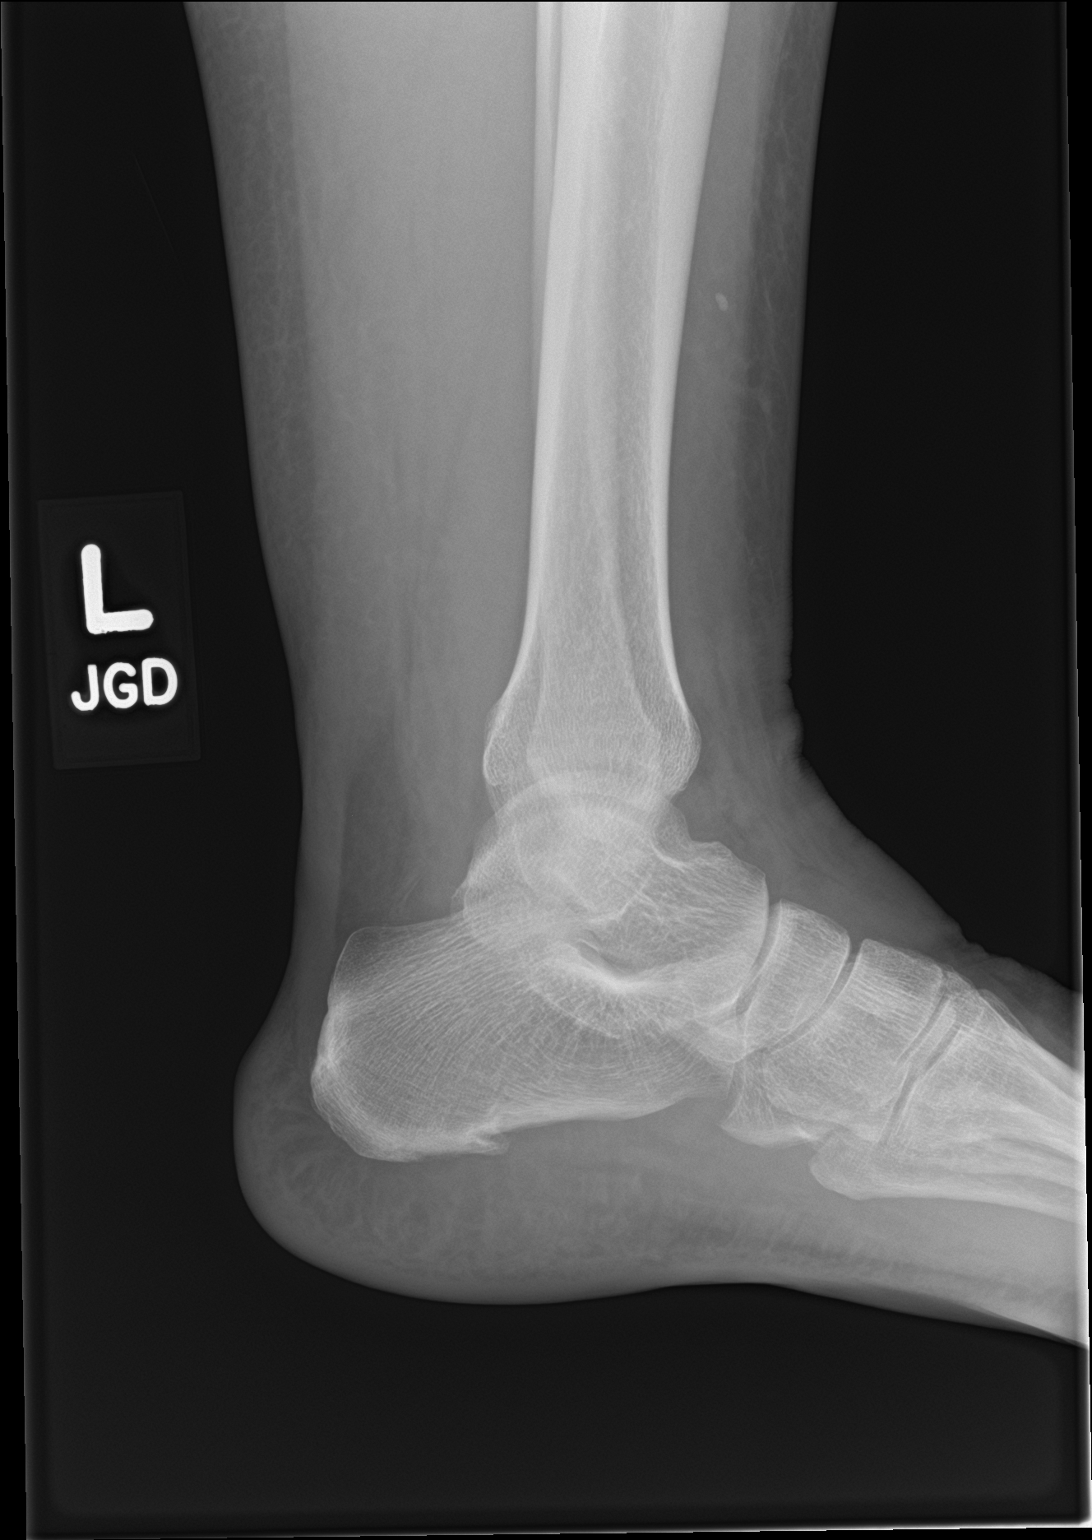

[3 of 3 positions shown; findings below may reference images not displayed]

FINDINGS: Soft tissue swelling, particularly laterally. The ankle mortise is
intact. No fractures are identified. No other acute abnormalities.
IMPRESSION: No bony abnormalities.  Soft tissue swelling.

## 2023-05-24 ENCOUNTER — Emergency Department (HOSPITAL_COMMUNITY)
Admission: EM | Admit: 2023-05-24 | Discharge: 2023-05-24 | Disposition: A | Discharge status: Home / Self Care | Payer: Medicaid Other | Attending: Emergency Medicine | Admitting: Emergency Medicine

## 2023-05-24 ENCOUNTER — Other Ambulatory Visit: Payer: Self-pay

## 2023-05-24 DIAGNOSIS — S39011A Strain of muscle, fascia and tendon of abdomen, initial encounter: Secondary | ICD-10-CM | POA: Diagnosis not present

## 2023-05-24 DIAGNOSIS — X58XXXA Exposure to other specified factors, initial encounter: Secondary | ICD-10-CM | POA: Insufficient documentation

## 2023-05-24 DIAGNOSIS — S3991XA Unspecified injury of abdomen, initial encounter: Secondary | ICD-10-CM | POA: Diagnosis present

## 2023-05-24 DIAGNOSIS — I10 Essential (primary) hypertension: Secondary | ICD-10-CM | POA: Insufficient documentation

## 2023-05-24 DIAGNOSIS — R109 Unspecified abdominal pain: Secondary | ICD-10-CM

## 2023-05-24 DIAGNOSIS — Z79899 Other long term (current) drug therapy: Secondary | ICD-10-CM | POA: Diagnosis not present

## 2023-05-24 LAB — COMPREHENSIVE METABOLIC PANEL
ALT: 13 U/L (ref 0–44)
AST: 14 U/L — ABNORMAL LOW (ref 15–41)
Albumin: 3.7 g/dL (ref 3.5–5.0)
Alkaline Phosphatase: 89 U/L (ref 38–126)
Anion gap: 6 (ref 5–15)
BUN: 7 mg/dL (ref 6–20)
CO2: 24 mmol/L (ref 22–32)
Calcium: 9 mg/dL (ref 8.9–10.3)
Chloride: 109 mmol/L (ref 98–111)
Creatinine, Ser: 0.68 mg/dL (ref 0.44–1.00)
GFR, Estimated: >60 mL/min (ref >60)
Glucose, Bld: 85 mg/dL (ref 70–99)
Potassium: 3.8 mmol/L (ref 3.5–5.1)
Sodium: 139 mmol/L (ref 135–145)
Total Bilirubin: 0.8 mg/dL (ref 0.0–1.2)
Total Protein: 7.2 g/dL (ref 6.5–8.1)

## 2023-05-24 LAB — CBC
HCT: 45.5 % (ref 36.0–46.0)
Hemoglobin: 15.3 g/dL — ABNORMAL HIGH (ref 12.0–15.0)
MCH: 29.9 pg (ref 26.0–34.0)
MCHC: 33.6 g/dL (ref 30.0–36.0)
MCV: 89 fL (ref 80.0–100.0)
Platelets: 269 10*3/uL (ref 150–400)
RBC: 5.11 MIL/uL (ref 3.87–5.11)
RDW: 13.2 % (ref 11.5–15.5)
WBC: 8.1 10*3/uL (ref 4.0–10.5)
nRBC: 0 % (ref 0.0–0.2)

## 2023-05-24 LAB — URINALYSIS, ROUTINE W REFLEX MICROSCOPIC
Bilirubin Urine: NEGATIVE
Glucose, UA: NEGATIVE mg/dL
Hgb urine dipstick: NEGATIVE
Ketones, ur: NEGATIVE mg/dL
Leukocytes,Ua: NEGATIVE
Nitrite: NEGATIVE
Protein, ur: NEGATIVE mg/dL
Specific Gravity, Urine: 1.005 (ref 1.005–1.030)
pH: 6 (ref 5.0–8.0)

## 2023-05-24 LAB — LIPASE, BLOOD: Lipase: 35 U/L (ref 11–51)

## 2023-05-24 MED ORDER — CYCLOBENZAPRINE HCL 5 MG PO TABS: 5.0000 mg | ORAL_TABLET | Freq: Three times a day (TID) | ORAL | 0 refills | Status: AC | PRN

## 2023-05-24 MED ORDER — ONDANSETRON 4 MG PO TBDP
4.0000 mg | ORAL_TABLET | Freq: Once | ORAL | Status: AC
  Administered 2023-05-24: 4 mg via ORAL
  Filled 2023-05-24: qty 1

## 2023-05-24 MED ORDER — CYCLOBENZAPRINE HCL 5 MG PO TABS: 5.0000 mg | ORAL_TABLET | Freq: Three times a day (TID) | ORAL | 0 refills | Status: DC | PRN

## 2023-05-24 MED ORDER — ONDANSETRON 4 MG PO TBDP: 4.0000 mg | ORAL_TABLET | Freq: Three times a day (TID) | ORAL | 0 refills | Status: AC | PRN

## 2023-05-24 MED ORDER — ONDANSETRON 4 MG PO TBDP: 4.0000 mg | ORAL_TABLET | Freq: Three times a day (TID) | ORAL | 0 refills | Status: DC | PRN

## 2023-05-24 NOTE — ED Provider Notes (Signed)
 [facility] EMER[facility]PART[facility] AT [facility] Provider Note   CSN: [id number] Arrival date & time: 05/24/23  1628     History  Chief Complaint  Patient presents with   Back Pain    Julie Gross is a 53 y.o. 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  She states she was painting, couple days ago, and felt like she pulled her back.  She rested, and got a little bit better, but every time she moves, she feels a twinge, to her right flank.  She states the pain is sharp, at times, and can be spastic.  Not associated with any urinary symptoms, however she felt like she is getting some chills, and having some nausea so she wanted to come in and get checked out.  The pain is only when she moves, and is not associated with eating.  Has taken some ibuprofen, And tried some different positions, which she states helps.  She has also been taking some muscle rub which helps.    Home Medications Prior to Admission medications   Medication Sig Start Date End Date Taking? Authorizing Provider  cyclobenzaprine (FLEXERIL) 5 MG tablet Take 1 tablet (5 mg total) by mouth 3 (three) times daily as needed. 05/24/23  Yes Jafeth Mustin L, PA  ondansetron (ZOFRAN-ODT) 4 MG disintegrating tablet Take 1 tablet (4 mg total) by mouth every 8 (eight) hours as needed. 05/24/23  Yes Camari Quintanilla L, PA  amLODipine (NORVASC) 10 MG tablet Take 1 tablet (10 mg total) by mouth daily. PATIENT NEEDS OFFICE VISIT FOR ADDITIONAL REFILLS 01/28/15   Valarie Cones, Dema Severin, PA-C  olmesartan (BENICAR) 20 MG tablet Take 20 mg by mouth daily. 08/23/20   [provider]  valACYclovir (VALTREX) 500 MG tablet valacyclovir 500 mg tablet  TAKE 1 TABLET TWICE DAILY X 3 DAYS FOR AN OUTBREAK AND ONE TABLET DAILY AFTER THIS FOR SUPPRESSION. 04/24/19   [provider]  Vitamin D, Ergocalciferol, (DRISDOL) 1.25 MG  (50000 UNIT) CAPS capsule TAKE 1 CAPSULE (50,000 UNITS DOSE) BY MOUTH ONCE A WEEK FOR 12 DOSES 07/17/20   [provider]      Allergies    Lisinopril, Desloratadine, and Norfloxacin    Review of Systems   Review of Systems  Respiratory:  Negative for shortness of breath.   Musculoskeletal:  Positive for back pain.    Physical Exam Updated Vital Signs BP (!) 193/107 (BP Location: Right Arm)   Pulse 65   Temp 98.4 F (36.9 C)   Resp 20   Ht 5\' 1"  (1.549 m)   Wt 95.3 kg   LMP 02/13/2011   SpO2 99%   BMI 39.70 kg/m  Physical Exam Vitals and nursing note reviewed.  Constitutional:      General: She is not in acute distress.    Appearance: She is well-developed.  HENT:     Head: Normocephalic and atraumatic.  Eyes:     Conjunctiva/sclera: Conjunctivae normal.  Cardiovascular:     Rate and Rhythm: Normal rate and regular rhythm.     Heart sounds: No murmur heard. Pulmonary:     Effort: Pulmonary effort is normal. No respiratory distress.     Breath sounds: Normal breath sounds.  Abdominal:     Palpations: Abdomen is soft.     Tenderness: There is no abdominal tenderness.  Musculoskeletal:  General: No swelling.     Cervical back: Neck supple.     Comments: Tenderness to palpation of right flank, without any guarding, rebound.  Pain worse with rotation of the trunk, and flexion of the trunk.  Skin:    General: Skin is warm and dry.     Capillary Refill: Capillary refill takes less than 2 seconds.  Neurological:     Mental Status: She is alert.  Psychiatric:        Mood and Affect: Mood normal.     ED Results / Procedures / Treatments   Labs (all labs ordered are listed, but only abnormal results are displayed) Labs Reviewed  COMPREHENSIVE METABOLIC PANEL - Abnormal; Notable for the following components:      Result Value   AST 14 (*)    All other components within normal limits  CBC - Abnormal; Notable for the following components:   Hemoglobin  15.3 (*)    All other components within normal limits  URINALYSIS, ROUTINE W REFLEX MICROSCOPIC - Abnormal; Notable for the following components:   Color, Urine STRAW (*)    All other components within normal limits  LIPASE, BLOOD    EKG None  Radiology No results found.  Procedures Procedures    Medications Ordered in ED Medications  ondansetron (ZOFRAN-ODT) disintegrating tablet 4 mg (has no administration in time range)    ED Course/ Medical Decision Making/ A&P                                 Medical Decision Making Is a well-appearing 53 year old female, here for right trunk pain, that is been going on for the last 2 days, after painting.  Rest makes it better, moving aggravates it.  Will obtain labs including urinalysis, for further evaluation.  Her abdomen is fairly soft, and she has no guarding or rebound, mild tenderness to palpation along right flank.  No wounds or rash present.  Amount and/or Complexity of Data Reviewed Labs:     Details: Labs unremarkable, urinalysis unremarkable Discussion of management or test interpretation with external provider(s): Discussed with patient, I am suspicious that her pain is likely secondary to muscle strain, from painting.  Her urine is unremarkable, and her blood work is reassuring.  She is overall well-appearing, given that the pain is worse with movement, and she is tender on palpation, I believe that this likely represents an abdominal muscle strain.  We discussed Tylenol ibuprofen, I sent a muscle relaxers, and some nausea medicine to the pharmacy.  Discussed return precautions, she has no midline or her low back, pain, it is mostly to the right flank currently.  Overall well-appearing, discharged home  Risk Prescription drug management.    Final Clinical Impression(s) / ED Diagnoses Final diagnoses:  Strain of abdominal muscle, initial encounter  Right flank pain    Rx / DC Orders ED Discharge Orders           Ordered    cyclobenzaprine (FLEXERIL) 5 MG tablet  3 times daily PRN        05/24/23 1833    ondansetron (ZOFRAN-ODT) 4 MG disintegrating tablet  Every 8 hours PRN        05/24/23 1833              Krishna Heuer, Harley Alto, PA 05/24/23 1610    Alvira Monday, MD 05/25/23 1524

## 2023-05-24 NOTE — Discharge Instructions (Addendum)
 Please continue taking Tylenol, ibuprofen, and muscle relaxers, for your pain.  Use IcyHot, as well to help with the pain.  I prescribed you some nausea medicine, and muscle relaxers, to help with the pain.  If you have any fevers, chills, worsening pain, intractable nausea, vomiting please return

## 2023-05-24 NOTE — ED Triage Notes (Signed)
 The pt is c/o rt flank and rt lower abd since Wednesday nausea and chills  and urinary frequency  for 2 days hx kidney stones
